# Patient Record
Sex: Female | Born: 1970
Health system: Southern US, Community
[De-identification: ages and names within clinical notes are randomized; demographics above are authoritative.]

## PROBLEM LIST (undated history)

## (undated) DIAGNOSIS — Z9889 Other specified postprocedural states: Secondary | ICD-10-CM

## (undated) DIAGNOSIS — Z86018 Personal history of other benign neoplasm: Secondary | ICD-10-CM

## (undated) DIAGNOSIS — M7751 Other enthesopathy of right foot: Secondary | ICD-10-CM

## (undated) DIAGNOSIS — R112 Nausea with vomiting, unspecified: Secondary | ICD-10-CM

## (undated) DIAGNOSIS — F419 Anxiety disorder, unspecified: Secondary | ICD-10-CM

## (undated) DIAGNOSIS — Z87898 Personal history of other specified conditions: Secondary | ICD-10-CM

## (undated) DIAGNOSIS — G43109 Migraine with aura, not intractable, without status migrainosus: Secondary | ICD-10-CM

## (undated) DIAGNOSIS — N841 Polyp of cervix uteri: Secondary | ICD-10-CM

## (undated) HISTORY — DX: Personal history of other benign neoplasm: Z86.018

## (undated) HISTORY — PX: SEPTOPLASTY: SUR1290

## (undated) HISTORY — DX: Personal history of other specified conditions: Z87.898

## (undated) HISTORY — PX: COLPOSCOPY: SHX161

## (undated) HISTORY — DX: Migraine with aura, not intractable, without status migrainosus: G43.109

## (undated) HISTORY — DX: Polyp of cervix uteri: N84.1

---
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
## 1992-04-13 HISTORY — PX: RHINOPLASTY: SUR1284

## 1999-05-13 ENCOUNTER — Other Ambulatory Visit: Admission: RE | Admit: 1999-05-13 | Discharge: 1999-05-13 | Payer: Self-pay | Admitting: *Deleted

## 2000-05-26 ENCOUNTER — Other Ambulatory Visit: Admission: RE | Admit: 2000-05-26 | Discharge: 2000-05-26 | Payer: Self-pay | Admitting: *Deleted

## 2001-05-04 ENCOUNTER — Other Ambulatory Visit: Admission: RE | Admit: 2001-05-04 | Discharge: 2001-05-04 | Payer: Self-pay | Admitting: *Deleted

## 2002-06-20 ENCOUNTER — Other Ambulatory Visit: Admission: RE | Admit: 2002-06-20 | Discharge: 2002-06-20 | Payer: Self-pay | Admitting: *Deleted

## 2003-03-01 ENCOUNTER — Encounter: Admission: RE | Admit: 2003-03-01 | Discharge: 2003-03-01 | Payer: Self-pay | Admitting: Family Medicine

## 2003-06-26 ENCOUNTER — Other Ambulatory Visit: Admission: RE | Admit: 2003-06-26 | Discharge: 2003-06-26 | Payer: Self-pay | Admitting: Family Medicine

## 2004-12-10 ENCOUNTER — Other Ambulatory Visit: Admission: RE | Admit: 2004-12-10 | Discharge: 2004-12-10 | Payer: Self-pay | Admitting: Family Medicine

## 2007-07-11 ENCOUNTER — Encounter: Admission: RE | Admit: 2007-07-11 | Discharge: 2007-07-11 | Payer: Self-pay | Admitting: Family Medicine

## 2008-01-31 ENCOUNTER — Encounter: Admission: RE | Admit: 2008-01-31 | Discharge: 2008-01-31 | Payer: Self-pay | Admitting: Family Medicine

## 2008-10-18 ENCOUNTER — Encounter: Admission: RE | Admit: 2008-10-18 | Discharge: 2008-10-18 | Payer: Self-pay | Admitting: Family Medicine

## 2009-10-21 ENCOUNTER — Encounter: Admission: RE | Admit: 2009-10-21 | Discharge: 2009-10-21 | Payer: Self-pay | Admitting: Family Medicine

## 2010-06-18 ENCOUNTER — Other Ambulatory Visit (HOSPITAL_COMMUNITY)
Admission: RE | Admit: 2010-06-18 | Discharge: 2010-06-18 | Disposition: A | Discharge status: Home / Self Care | Payer: 59 | Source: Ambulatory Visit | Attending: Family Medicine | Admitting: Family Medicine

## 2010-06-18 ENCOUNTER — Other Ambulatory Visit: Payer: Self-pay | Admitting: Family Medicine

## 2010-06-18 DIAGNOSIS — Z124 Encounter for screening for malignant neoplasm of cervix: Secondary | ICD-10-CM | POA: Insufficient documentation

## 2010-07-30 ENCOUNTER — Other Ambulatory Visit: Payer: Self-pay | Admitting: Dermatology

## 2010-11-26 ENCOUNTER — Other Ambulatory Visit: Payer: Self-pay | Admitting: Family Medicine

## 2010-11-26 DIAGNOSIS — Z1231 Encounter for screening mammogram for malignant neoplasm of breast: Secondary | ICD-10-CM

## 2010-12-17 ENCOUNTER — Ambulatory Visit
Admission: RE | Admit: 2010-12-17 | Discharge: 2010-12-17 | Disposition: A | Discharge status: Home / Self Care | Payer: 59 | Source: Ambulatory Visit | Attending: Family Medicine | Admitting: Family Medicine

## 2010-12-17 DIAGNOSIS — Z1231 Encounter for screening mammogram for malignant neoplasm of breast: Secondary | ICD-10-CM

## 2011-04-14 DIAGNOSIS — Z86018 Personal history of other benign neoplasm: Secondary | ICD-10-CM

## 2011-04-14 HISTORY — DX: Personal history of other benign neoplasm: Z86.018

## 2011-09-01 ENCOUNTER — Other Ambulatory Visit: Payer: Self-pay | Admitting: Family Medicine

## 2011-09-01 ENCOUNTER — Other Ambulatory Visit (HOSPITAL_COMMUNITY)
Admission: RE | Admit: 2011-09-01 | Discharge: 2011-09-01 | Disposition: A | Discharge status: Home / Self Care | Payer: 59 | Source: Ambulatory Visit | Attending: Family Medicine | Admitting: Family Medicine

## 2011-09-01 DIAGNOSIS — Z1159 Encounter for screening for other viral diseases: Secondary | ICD-10-CM | POA: Insufficient documentation

## 2011-09-01 DIAGNOSIS — Z124 Encounter for screening for malignant neoplasm of cervix: Secondary | ICD-10-CM | POA: Insufficient documentation

## 2011-09-25 DIAGNOSIS — N841 Polyp of cervix uteri: Secondary | ICD-10-CM

## 2011-09-25 HISTORY — DX: Polyp of cervix uteri: N84.1

## 2011-09-28 ENCOUNTER — Other Ambulatory Visit: Payer: Self-pay | Admitting: Family Medicine

## 2011-09-28 DIAGNOSIS — Z1231 Encounter for screening mammogram for malignant neoplasm of breast: Secondary | ICD-10-CM

## 2011-10-12 ENCOUNTER — Other Ambulatory Visit: Payer: Self-pay | Admitting: Family Medicine

## 2011-10-12 DIAGNOSIS — R2230 Localized swelling, mass and lump, unspecified upper limb: Secondary | ICD-10-CM

## 2011-10-14 ENCOUNTER — Other Ambulatory Visit: Payer: 59

## 2011-10-20 ENCOUNTER — Ambulatory Visit
Admission: RE | Admit: 2011-10-20 | Discharge: 2011-10-20 | Disposition: A | Discharge status: Home / Self Care | Payer: 59 | Source: Ambulatory Visit | Attending: Family Medicine | Admitting: Family Medicine

## 2011-10-20 DIAGNOSIS — R2230 Localized swelling, mass and lump, unspecified upper limb: Secondary | ICD-10-CM

## 2011-12-18 ENCOUNTER — Ambulatory Visit
Admission: RE | Admit: 2011-12-18 | Discharge: 2011-12-18 | Disposition: A | Discharge status: Home / Self Care | Payer: 59 | Source: Ambulatory Visit | Attending: Family Medicine | Admitting: Family Medicine

## 2011-12-18 DIAGNOSIS — Z1231 Encounter for screening mammogram for malignant neoplasm of breast: Secondary | ICD-10-CM

## 2012-03-12 ENCOUNTER — Encounter (HOSPITAL_COMMUNITY): Payer: Self-pay | Admitting: Pharmacist

## 2012-03-18 ENCOUNTER — Encounter (HOSPITAL_COMMUNITY)
Admission: RE | Admit: 2012-03-18 | Discharge: 2012-03-18 | Disposition: A | Discharge status: Home / Self Care | Payer: 59 | Source: Ambulatory Visit | Attending: Obstetrics and Gynecology | Admitting: Obstetrics and Gynecology

## 2012-03-18 ENCOUNTER — Encounter (HOSPITAL_COMMUNITY): Payer: Self-pay

## 2012-03-18 HISTORY — DX: Anxiety disorder, unspecified: F41.9

## 2012-03-18 LAB — CBC
HCT: 37.7 % (ref 36.0–46.0)
Hemoglobin: 12.5 g/dL (ref 12.0–15.0)
MCH: 34.2 pg — ABNORMAL HIGH (ref 26.0–34.0)
MCHC: 33.2 g/dL (ref 30.0–36.0)
MCV: 103.3 fL — ABNORMAL HIGH (ref 78.0–100.0)
Platelets: 192 10*3/uL (ref 150–400)
RBC: 3.65 MIL/uL — ABNORMAL LOW (ref 3.87–5.11)
RDW: 13.7 % (ref 11.5–15.5)
WBC: 7.5 10*3/uL (ref 4.0–10.5)

## 2012-03-18 LAB — SURGICAL PCR SCREEN
MRSA, PCR: NEGATIVE
Staphylococcus aureus: POSITIVE — AB

## 2012-03-18 NOTE — Patient Instructions (Addendum)
Your procedure is scheduled on:03/22/12  Enter through the Main Entrance at :6am Pick up desk phone and dial [phone] and inform us of your arrival.  Please call [phone] if you have any problems the morning of surgery.  Remember: Do not eat or drink after midnight:MONDAY   Take these meds the morning of surgery with a sip of water:none  DO NOT wear jewelry, eye make-up, lipstick,body lotion, or dark fingernail polish. Do not shave for 48 hours prior to surgery.  If you are to be admitted after surgery, leave suitcase in car until your room has been assigned. Patients discharged on the day of surgery will not be allowed to drive home.

## 2012-03-21 MED ORDER — DEXTROSE 5 % IV SOLN
2.0000 g | INTRAVENOUS | Status: AC
  Administered 2012-03-22: 2 g via INTRAVENOUS
  Filled 2012-03-21: qty 2

## 2012-03-21 NOTE — H&P (Signed)
Jasmine Cochran is an 41 y.o. female.   Chief Complaint: heavy periods HPI: 41 yo SWF G0P0 with severe menorrhagia, worsening over the last few years.  PUS showed her uterus to be 8 x 5 x 4 cm with 3 fibroids, each about 1 cm.  Adnexa wnl.  Endo bx benign 10/14/11.  Elects difinitive management with hysterectomy.  Past Medical History  Diagnosis Date  . Anxiety   . Headache     H/O migraines  . No pertinent past medical history     Past Surgical History  Procedure Date  . Septoplasty     No family history on file. Social History:  reports that she has never smoked. She does not have any smokeless tobacco history on file. She reports that she drinks alcohol. She reports that she does not use illicit drugs.  Allergies: No Known Allergies  No prescriptions prior to admission    No results found for this or any previous visit (from the past 48 hour(s)). No results found.  Review of Systems  All other systems reviewed and are negative.    Height 5\' 10"  (1.778 m), weight 67.132 kg (148 lb). Physical Exam  Constitutional: She is oriented to person, place, and time. She appears well-developed and well-nourished.  HENT:  Head: Normocephalic and atraumatic.  Eyes: Conjunctivae normal are normal.  Neck: Normal range of motion. Neck supple. No thyromegaly present.  Cardiovascular: Normal rate, regular rhythm and normal heart sounds.   Respiratory: Effort normal and breath sounds normal.  GI: Soft. Bowel sounds are normal. She exhibits no distension. There is no tenderness.  Genitourinary: Vagina normal and uterus normal.  Musculoskeletal: Normal range of motion.  Neurological: She is alert and oriented to person, place, and time.  Skin: Skin is warm and dry.  Psychiatric: She has a normal mood and affect.     Assessment/Plan Menorrhagia, fibroids.  For R-TLH, bilat salpingectomy.  ROMINE,CYNTHIA P 03/21/2012, 11:36 AM

## 2012-03-22 ENCOUNTER — Encounter (HOSPITAL_COMMUNITY): Payer: Self-pay | Admitting: *Deleted

## 2012-03-22 ENCOUNTER — Encounter (HOSPITAL_COMMUNITY)
Admission: RE | Disposition: A | Discharge status: Home / Self Care | Payer: Self-pay | Source: Ambulatory Visit | Attending: Obstetrics and Gynecology

## 2012-03-22 ENCOUNTER — Ambulatory Visit (HOSPITAL_COMMUNITY): Payer: 59 | Admitting: Anesthesiology

## 2012-03-22 ENCOUNTER — Encounter (HOSPITAL_COMMUNITY): Payer: Self-pay | Admitting: Anesthesiology

## 2012-03-22 ENCOUNTER — Ambulatory Visit (HOSPITAL_COMMUNITY)
Admission: RE | Admit: 2012-03-22 | Discharge: 2012-03-22 | Disposition: A | Discharge status: Home / Self Care | Payer: 59 | Source: Ambulatory Visit | Attending: Obstetrics and Gynecology | Admitting: Obstetrics and Gynecology

## 2012-03-22 DIAGNOSIS — N920 Excessive and frequent menstruation with regular cycle: Secondary | ICD-10-CM | POA: Insufficient documentation

## 2012-03-22 DIAGNOSIS — D259 Leiomyoma of uterus, unspecified: Secondary | ICD-10-CM | POA: Insufficient documentation

## 2012-03-22 DIAGNOSIS — N838 Other noninflammatory disorders of ovary, fallopian tube and broad ligament: Secondary | ICD-10-CM | POA: Insufficient documentation

## 2012-03-22 DIAGNOSIS — N8000 Endometriosis of the uterus, unspecified: Secondary | ICD-10-CM | POA: Insufficient documentation

## 2012-03-22 DIAGNOSIS — Z01812 Encounter for preprocedural laboratory examination: Secondary | ICD-10-CM | POA: Insufficient documentation

## 2012-03-22 DIAGNOSIS — Z01818 Encounter for other preprocedural examination: Secondary | ICD-10-CM | POA: Insufficient documentation

## 2012-03-22 HISTORY — PX: BILATERAL SALPINGECTOMY: SHX5743

## 2012-03-22 HISTORY — DX: Other specified postprocedural states: Z98.890

## 2012-03-22 HISTORY — PX: ROBOTIC ASSISTED TOTAL HYSTERECTOMY: SHX6085

## 2012-03-22 HISTORY — PX: CYSTOSCOPY: SHX5120

## 2012-03-22 HISTORY — DX: Other specified postprocedural states: R11.2

## 2012-03-22 SURGERY — ROBOTIC ASSISTED TOTAL HYSTERECTOMY
Anesthesia: General | Site: Bladder | Wound class: Clean Contaminated

## 2012-03-22 MED ORDER — DEXAMETHASONE SODIUM PHOSPHATE 10 MG/ML IJ SOLN
INTRAMUSCULAR | Status: DC | PRN
  Administered 2012-03-22: 10 mg via INTRAVENOUS

## 2012-03-22 MED ORDER — GLYCOPYRROLATE 0.2 MG/ML IJ SOLN
INTRAMUSCULAR | Status: DC | PRN
  Administered 2012-03-22: 0.6 mg via INTRAVENOUS

## 2012-03-22 MED ORDER — LIDOCAINE HCL (CARDIAC) 20 MG/ML IV SOLN
INTRAVENOUS | Status: DC | PRN
  Administered 2012-03-22: 50 mg via INTRAVENOUS

## 2012-03-22 MED ORDER — NEOSTIGMINE METHYLSULFATE 1 MG/ML IJ SOLN
INTRAMUSCULAR | Status: DC | PRN
  Administered 2012-03-22: 4 mg via INTRAVENOUS

## 2012-03-22 MED ORDER — MIDAZOLAM HCL 2 MG/2ML IJ SOLN
INTRAMUSCULAR | Status: AC
  Filled 2012-03-22: qty 2

## 2012-03-22 MED ORDER — ARTIFICIAL TEARS OP OINT
TOPICAL_OINTMENT | OPHTHALMIC | Status: DC | PRN
  Administered 2012-03-22: 1 via OPHTHALMIC

## 2012-03-22 MED ORDER — LIDOCAINE HCL (CARDIAC) 20 MG/ML IV SOLN
INTRAVENOUS | Status: AC
  Filled 2012-03-22: qty 5

## 2012-03-22 MED ORDER — SCOPOLAMINE 1 MG/3DAYS TD PT72
1.0000 | MEDICATED_PATCH | TRANSDERMAL | Status: DC
  Administered 2012-03-22: 1.5 mg via TRANSDERMAL

## 2012-03-22 MED ORDER — ACETAMINOPHEN 10 MG/ML IV SOLN
INTRAVENOUS | Status: AC
  Filled 2012-03-22: qty 100

## 2012-03-22 MED ORDER — ACETAMINOPHEN 10 MG/ML IV SOLN
1000.0000 mg | Freq: Once | INTRAVENOUS | Status: AC
  Administered 2012-03-22: 1000 mg via INTRAVENOUS

## 2012-03-22 MED ORDER — SODIUM CHLORIDE 0.9 % IJ SOLN
INTRAMUSCULAR | Status: DC | PRN
  Administered 2012-03-22: 60 mL

## 2012-03-22 MED ORDER — ROCURONIUM BROMIDE 50 MG/5ML IV SOLN
INTRAVENOUS | Status: AC
  Filled 2012-03-22: qty 1

## 2012-03-22 MED ORDER — MIDAZOLAM HCL 5 MG/5ML IJ SOLN
INTRAMUSCULAR | Status: DC | PRN
  Administered 2012-03-22: 2 mg via INTRAVENOUS

## 2012-03-22 MED ORDER — PROPOFOL 10 MG/ML IV BOLUS
INTRAVENOUS | Status: DC | PRN
  Administered 2012-03-22: 170 mg via INTRAVENOUS

## 2012-03-22 MED ORDER — STERILE WATER FOR IRRIGATION IR SOLN
Status: DC | PRN
  Administered 2012-03-22: 1000 mL via INTRAVESICAL

## 2012-03-22 MED ORDER — INDIGOTINDISULFONATE SODIUM 8 MG/ML IJ SOLN
INTRAMUSCULAR | Status: AC
  Filled 2012-03-22: qty 5

## 2012-03-22 MED ORDER — PROMETHAZINE HCL 25 MG/ML IJ SOLN: 6.2500 mg | INTRAMUSCULAR | Status: DC | PRN

## 2012-03-22 MED ORDER — GLYCOPYRROLATE 0.2 MG/ML IJ SOLN
INTRAMUSCULAR | Status: AC
  Filled 2012-03-22: qty 2

## 2012-03-22 MED ORDER — FENTANYL CITRATE 0.05 MG/ML IJ SOLN
INTRAMUSCULAR | Status: DC | PRN
  Administered 2012-03-22: 50 ug via INTRAVENOUS
  Administered 2012-03-22: 150 ug via INTRAVENOUS
  Administered 2012-03-22: 50 ug via INTRAVENOUS

## 2012-03-22 MED ORDER — MORPHINE SULFATE 4 MG/ML IJ SOLN: 1.0000 mg | INTRAMUSCULAR | Status: DC | PRN

## 2012-03-22 MED ORDER — GLYCOPYRROLATE 0.2 MG/ML IJ SOLN
INTRAMUSCULAR | Status: AC
  Filled 2012-03-22: qty 1

## 2012-03-22 MED ORDER — NEOSTIGMINE METHYLSULFATE 1 MG/ML IJ SOLN
INTRAMUSCULAR | Status: AC
  Filled 2012-03-22: qty 10

## 2012-03-22 MED ORDER — ONDANSETRON HCL 4 MG/2ML IJ SOLN
INTRAMUSCULAR | Status: AC
  Filled 2012-03-22: qty 2

## 2012-03-22 MED ORDER — OXYCODONE-ACETAMINOPHEN 5-325 MG PO TABS
1.0000 | ORAL_TABLET | ORAL | Status: DC | PRN
  Administered 2012-03-22: 1 via ORAL
  Filled 2012-03-22: qty 1

## 2012-03-22 MED ORDER — FENTANYL CITRATE 0.05 MG/ML IJ SOLN
INTRAMUSCULAR | Status: AC
  Administered 2012-03-22: 25 ug via INTRAVENOUS
  Filled 2012-03-22: qty 2

## 2012-03-22 MED ORDER — ARTIFICIAL TEARS OP OINT
TOPICAL_OINTMENT | OPHTHALMIC | Status: AC
  Filled 2012-03-22: qty 3.5

## 2012-03-22 MED ORDER — MEPERIDINE HCL 25 MG/ML IJ SOLN: 6.2500 mg | INTRAMUSCULAR | Status: DC | PRN

## 2012-03-22 MED ORDER — LACTATED RINGERS IR SOLN
Status: DC | PRN
  Administered 2012-03-22: 3000 mL

## 2012-03-22 MED ORDER — SCOPOLAMINE 1 MG/3DAYS TD PT72
MEDICATED_PATCH | TRANSDERMAL | Status: AC
  Administered 2012-03-22: 1.5 mg via TRANSDERMAL
  Filled 2012-03-22: qty 1

## 2012-03-22 MED ORDER — ROPIVACAINE HCL 5 MG/ML IJ SOLN
INTRAMUSCULAR | Status: AC
  Filled 2012-03-22: qty 60

## 2012-03-22 MED ORDER — FENTANYL CITRATE 0.05 MG/ML IJ SOLN
25.0000 ug | INTRAMUSCULAR | Status: DC | PRN
  Administered 2012-03-22: 25 ug via INTRAVENOUS

## 2012-03-22 MED ORDER — ROCURONIUM BROMIDE 100 MG/10ML IV SOLN
INTRAVENOUS | Status: DC | PRN
  Administered 2012-03-22: 10 mg via INTRAVENOUS
  Administered 2012-03-22: 60 mg via INTRAVENOUS

## 2012-03-22 MED ORDER — INDIGOTINDISULFONATE SODIUM 8 MG/ML IJ SOLN
INTRAMUSCULAR | Status: DC | PRN
  Administered 2012-03-22: 5 mL via INTRAVENOUS

## 2012-03-22 MED ORDER — FENTANYL CITRATE 0.05 MG/ML IJ SOLN
INTRAMUSCULAR | Status: AC
  Filled 2012-03-22: qty 5

## 2012-03-22 MED ORDER — ONDANSETRON HCL 4 MG/2ML IJ SOLN
INTRAMUSCULAR | Status: DC | PRN
  Administered 2012-03-22: 4 mg via INTRAVENOUS

## 2012-03-22 MED ORDER — DEXAMETHASONE SODIUM PHOSPHATE 10 MG/ML IJ SOLN
INTRAMUSCULAR | Status: AC
  Filled 2012-03-22: qty 1

## 2012-03-22 MED ORDER — LACTATED RINGERS IV SOLN
INTRAVENOUS | Status: DC
  Administered 2012-03-22: 09:00:00 via INTRAVENOUS
  Administered 2012-03-22: 125 mL/h via INTRAVENOUS

## 2012-03-22 MED ORDER — ROPIVACAINE HCL 5 MG/ML IJ SOLN
INTRAMUSCULAR | Status: DC | PRN
  Administered 2012-03-22: 60 mL

## 2012-03-22 MED ORDER — PROPOFOL 10 MG/ML IV EMUL
INTRAVENOUS | Status: AC
  Filled 2012-03-22: qty 20

## 2012-03-22 SURGICAL SUPPLY — 77 items
ADH SKN CLS APL DERMABOND .7 (GAUZE/BANDAGES/DRESSINGS) ×3
APL SKNCLS STERI-STRIP NONHPOA (GAUZE/BANDAGES/DRESSINGS)
BAG URINE DRAINAGE (UROLOGICAL SUPPLIES) ×4 IMPLANT
BARRIER ADHS 3X4 INTERCEED (GAUZE/BANDAGES/DRESSINGS) ×4 IMPLANT
BENZOIN TINCTURE PRP APPL 2/3 (GAUZE/BANDAGES/DRESSINGS) ×3 IMPLANT
BRR ADH 4X3 ABS CNTRL BYND (GAUZE/BANDAGES/DRESSINGS) ×3
CABLE HIGH FREQUENCY MONO STRZ (ELECTRODE) ×4 IMPLANT
CHLORAPREP W/TINT 26ML (MISCELLANEOUS) ×4 IMPLANT
CONT PATH 16OZ SNAP LID 3702 (MISCELLANEOUS) ×4 IMPLANT
COVER MAYO STAND STRL (DRAPES) ×4 IMPLANT
COVER TABLE BACK 60X90 (DRAPES) ×8 IMPLANT
COVER TIP SHEARS 8 DVNC (MISCELLANEOUS) ×3 IMPLANT
COVER TIP SHEARS 8MM DA VINCI (MISCELLANEOUS) ×1
DECANTER SPIKE VIAL GLASS SM (MISCELLANEOUS) ×7 IMPLANT
DERMABOND ADVANCED (GAUZE/BANDAGES/DRESSINGS) ×1
DERMABOND ADVANCED .7 DNX12 (GAUZE/BANDAGES/DRESSINGS) ×2 IMPLANT
DRAPE HUG U DISPOSABLE (DRAPE) ×4 IMPLANT
DRAPE LG THREE QUARTER DISP (DRAPES) ×8 IMPLANT
DRAPE WARM FLUID 44X44 (DRAPE) ×4 IMPLANT
ELECT REM PT RETURN 9FT ADLT (ELECTROSURGICAL) ×4
ELECTRODE REM PT RTRN 9FT ADLT (ELECTROSURGICAL) ×3 IMPLANT
EVACUATOR SMOKE 8.L (FILTER) ×4 IMPLANT
GAUZE VASELINE 3X9 (GAUZE/BANDAGES/DRESSINGS) IMPLANT
GLOVE BIO SURGEON STRL SZ 6.5 (GLOVE) ×13 IMPLANT
GLOVE BIOGEL PI IND STRL 7.0 (GLOVE) ×12 IMPLANT
GLOVE BIOGEL PI INDICATOR 7.0 (GLOVE) ×5
GLOVE ECLIPSE 6.5 STRL STRAW (GLOVE) ×16 IMPLANT
GLOVE SURG SS PI 7.0 STRL IVOR (GLOVE) ×4 IMPLANT
GOWN STRL REIN XL XLG (GOWN DISPOSABLE) ×26 IMPLANT
GYRUS RUMI II 2.5CM BLUE (DISPOSABLE)
GYRUS RUMI II 3.5CM BLUE (DISPOSABLE)
GYRUS RUMI II 4.0CM BLUE (DISPOSABLE)
KIT ACCESSORY DA VINCI DISP (KITS) ×1
KIT ACCESSORY DVNC DISP (KITS) ×3 IMPLANT
LEGGING LITHOTOMY PAIR STRL (DRAPES) ×4 IMPLANT
NDL INSUFFLATION 14GA 120MM (NEEDLE) ×2 IMPLANT
NEEDLE INSUFFLATION 14GA 120MM (NEEDLE) ×4 IMPLANT
OCCLUDER COLPOPNEUMO (BALLOONS) ×1 IMPLANT
PACK LAVH (CUSTOM PROCEDURE TRAY) ×4 IMPLANT
PAD PREP 24X48 CUFFED NSTRL (MISCELLANEOUS) ×8 IMPLANT
PLUG CATH AND CAP STER (CATHETERS) ×4 IMPLANT
PROTECTOR NERVE ULNAR (MISCELLANEOUS) ×9 IMPLANT
RUMI II 3.0CM BLUE KOH-EFFICIE (DISPOSABLE) IMPLANT
RUMI II GYRUS 2.5CM BLUE (DISPOSABLE) IMPLANT
RUMI II GYRUS 3.5CM BLUE (DISPOSABLE) IMPLANT
RUMI II GYRUS 4.0CM BLUE (DISPOSABLE) IMPLANT
SET CYSTO W/LG BORE CLAMP LF (SET/KITS/TRAYS/PACK) ×4 IMPLANT
SET IRRIG TUBING LAPAROSCOPIC (IRRIGATION / IRRIGATOR) ×4 IMPLANT
SOLUTION ELECTROLUBE (MISCELLANEOUS) ×4 IMPLANT
STRIP CLOSURE SKIN 1/4X4 (GAUZE/BANDAGES/DRESSINGS) ×4 IMPLANT
SUT VIC AB 0 CT1 27 (SUTURE) ×12
SUT VIC AB 0 CT1 27XBRD ANBCTR (SUTURE) ×4 IMPLANT
SUT VIC AB 0 CT1 27XBRD ANTBC (SUTURE) IMPLANT
SUT VICRYL 0 UR6 27IN ABS (SUTURE) ×4 IMPLANT
SUT VICRYL RAPIDE 4/0 PS 2 (SUTURE) ×8 IMPLANT
SUT VLOC 180 0 9IN  GS21 (SUTURE) ×1
SUT VLOC 180 0 9IN GS21 (SUTURE) IMPLANT
SYR 30ML LL (SYRINGE) ×4 IMPLANT
SYR 50ML LL SCALE MARK (SYRINGE) ×4 IMPLANT
SYRINGE 10CC LL (SYRINGE) ×4 IMPLANT
SYSTEM CONVERTIBLE TROCAR (TROCAR) ×4 IMPLANT
TIP RUMI ORANGE 6.7MMX12CM (TIP) IMPLANT
TIP UTERINE 5.1X6CM LAV DISP (MISCELLANEOUS) IMPLANT
TIP UTERINE 6.7X10CM GRN DISP (MISCELLANEOUS) IMPLANT
TIP UTERINE 6.7X6CM WHT DISP (MISCELLANEOUS) IMPLANT
TIP UTERINE 6.7X8CM BLUE DISP (MISCELLANEOUS) ×3 IMPLANT
TOWEL OR 17X24 6PK STRL BLUE (TOWEL DISPOSABLE) ×12 IMPLANT
TRAY FOLEY BAG SILVER LF 14FR (CATHETERS) ×4 IMPLANT
TROCAR 12M 150ML BLUNT (TROCAR) ×2 IMPLANT
TROCAR DISP BLADELESS 8 DVNC (TROCAR) ×3 IMPLANT
TROCAR DISP BLADELESS 8MM (TROCAR) ×1
TROCAR XCEL 12X100 BLDLESS (ENDOMECHANICALS) IMPLANT
TROCAR XCEL NON-BLD 5MMX100MML (ENDOMECHANICALS) ×4 IMPLANT
TROCAR Z THRD FIOS 12X150 (TROCAR) ×4 IMPLANT
TUBING FILTER THERMOFLATOR (ELECTROSURGICAL) ×4 IMPLANT
WARMER LAPAROSCOPE (MISCELLANEOUS) ×4 IMPLANT
WATER STERILE IRR 1000ML POUR (IV SOLUTION) ×12 IMPLANT

## 2012-03-22 NOTE — Transfer of Care (Signed)
Immediate Anesthesia Transfer of Care Note  Patient: Jasmine Cochran  Procedure(s) Performed: Procedure(s) (LRB) with comments: ROBOTIC ASSISTED TOTAL HYSTERECTOMY (N/A) BILATERAL SALPINGECTOMY (Bilateral) CYSTOSCOPY (N/A)  Patient Location: PACU  Anesthesia Type:General  Level of Consciousness: awake, alert  and patient cooperative  Airway & Oxygen Therapy: Patient Spontanous Breathing and Patient connected to nasal cannula oxygen  Post-op Assessment: Report given to PACU RN  Post vital signs: Reviewed  Complications: No apparent anesthesia complications

## 2012-03-22 NOTE — Interval H&P Note (Signed)
History and Physical Interval Note:  03/22/2012 7:17 AM  Jasmine Cochran  has presented today for surgery, with the diagnosis of Menorrhagia; Fibroids  The various methods of treatment have been discussed with the patient and family. After consideration of risks, benefits and other options for treatment, the patient has consented to  Procedure(s) (LRB) with comments: ROBOTIC ASSISTED TOTAL HYSTERECTOMY (N/A) - request 2.5 hours as a surgical intervention .  The patient's history has been reviewed, patient examined, no change in status, stable for surgery.  I have reviewed the patient's chart and labs.  Questions were answered to the patient's satisfaction.     [place] P

## 2012-03-22 NOTE — Preoperative (Signed)
Beta Blockers   Reason not to administer Beta Blockers:Not Applicable 

## 2012-03-22 NOTE — Anesthesia Preprocedure Evaluation (Addendum)
Anesthesia Evaluation  Patient identified by MRN, date of birth, ID band Patient awake    Reviewed: Allergy & Precautions, H&P , NPO status , Patient's Chart, lab work & pertinent test results  History of Anesthesia Complications (+) PONV  Airway Mallampati: II TM Distance: >3 FB Neck ROM: Full    Dental  (+) Teeth Intact and Chipped,    Pulmonary neg pulmonary ROS,  breath sounds clear to auscultation        Cardiovascular negative cardio ROS  Rhythm:Regular Rate:Normal     Neuro/Psych  Headaches, Anxiety    GI/Hepatic negative GI ROS, Neg liver ROS,   Endo/Other  negative endocrine ROS  Renal/GU negative Renal ROS  negative genitourinary   Musculoskeletal negative musculoskeletal ROS (+)   Abdominal Normal abdominal exam  (+) - obese,   Peds  Hematology negative hematology ROS (+)   Anesthesia Other Findings   Reproductive/Obstetrics Menorrhagia  Fibroid Uterus                          Anesthesia Physical Anesthesia Plan  ASA: II  Anesthesia Plan: General   Post-op Pain Management:    Induction: Intravenous  Airway Management Planned: Oral ETT  Additional Equipment:   Intra-op Plan:   Post-operative Plan: Extubation in OR  Informed Consent: I have reviewed the patients History and Physical, chart, labs and discussed the procedure including the risks, benefits and alternatives for the proposed anesthesia with the patient or authorized representative who has indicated his/her understanding and acceptance.   Dental advisory given  Plan Discussed with: CRNA, Anesthesiologist and Surgeon  Anesthesia Plan Comments:         Anesthesia Quick Evaluation

## 2012-03-22 NOTE — Anesthesia Postprocedure Evaluation (Signed)
Anesthesia Post Note  Patient: Jasmine Cochran  Procedure(s) Performed: Procedure(s) (LRB): ROBOTIC ASSISTED TOTAL HYSTERECTOMY (N/A) BILATERAL SALPINGECTOMY (Bilateral) CYSTOSCOPY (N/A)  Anesthesia type: General  Patient location: PACU  Post pain: Pain level controlled  Post assessment: Post-op Vital signs reviewed  Last Vitals:  Filed Vitals:   03/22/12 1015  BP: 104/64  Pulse: 52  Temp:   Resp: 18    Post vital signs: Reviewed  Level of consciousness: sedated  Complications: No apparent anesthesia complications

## 2012-03-22 NOTE — Op Note (Signed)
Preoperative diagnosis: Menorrhagia, fibroids Postoperative diagnosis: Same, path pending Procedure: Robotic assisted total laparoscopic hysterectomy, bilateral salpingectomy, cystoscopy Surgeon: Dr. Meredeth Ide Assistant: Dr. Douglass Rivers Anesthesia: Gen. endotracheal Estimated blood loss: 50 cc Complications: None  Procedure: The patient was taken to the operating room, and after she was given IV sedation, she was placed in the low dorsolithotomy position.  She was then prepped and draped in usual fashion.  A posterior weighted and anterior Deaver retractor were placed into the vagina.  The cervix was grasped on its anterior lip with a single-tooth tenaculum.  The cervix was dilated to a #19 Shawnie Pons.  The uterus sounded to 8 cm     . An 8 cm      Rumi manipulator with a  small KOH ring was placed without difficulty.  A Foley catheter was then placed. The skin at the umbilicus was anesthetized with quarter percent ropivacaine, incised, and a vares needle was inserted into the peritoneal space.  Proper placement was noted using the hanging drop technique.  Pneumoperitoneum was accomplished using the automatic insufflator with 3 L of CO2.  A 12 mm bladed trocar was then inserted into the peritoneal space and its proper placement noted with the laparoscope.  60 cc of quarter percent ropivacaine was inserted into the peritoneal space.  Sites were marked for the robotic trocars and for the assistance trocar using transillumination, the skin was anesthetized with quarter percent ropivicaine, incised with a knife, and the trocars were inserted under direct visualization.  The patient was then placed in steep Trendelenburg position the robot was brought in and docked.  Monopolar scissors were inserted through port one and the PK gyrus through port two, both under direct visualization.  The surgeon then proceeded to the console.  The ureters were identified and seen peristalsing bilaterally.  The procedure began  on the patient's right, identifying the fallopian tube, and separating it from the ovary and along the length of the mesosalpinx with serial coagulation and cutting.  The utero-ovarian ligament was then cauterized and cut.  The round ligament on the right was cauterized and cut.  The anterior and posterior leaves of the broad ligament were taken down sharply.  The bladder was dissected sharply off the [facility] ring.  The uterine artery was skeletonized, coagulated multiple times, and cut.  Attention was next turned to the patient's left.  The tube was likewise cauterized and cut along its length to separate it from the ovary and the mesosalpinx.  The utero-ovarian ligament on the left was cauterized and cut.  The round ligament on the left was cauterized and cut.  The anterior posterior leaves of the broad ligament were taken down sharply.  Further dissection was done to dissect the bladder off the [facility] ring.  The left uterine artery was skeletonized, coagulated multiple times, and cut.  A circumferential colpotomy incision was then made with monopolar scissors.  The uterus was then delivered into the vagina with the fallopian tubes attached.  Robotic instruments were then changed out for a mega-suture cut needle driver in port 1 and a Cobra grasper in port 2.   A V. LOC suture was dropped in through the trocar at the umbilicus, and carried into the pelvis.  The vaginal cuff was closed with a 0 Vicryl running V. LOC suture.  The needle was parked in the right paracolic gutter.  The pelvis was irrigated and found to be hemostatic.  The ureters were again identified and seen peristalsing bilaterally.  A sheet of Interceed was brought in through one of the robotic trocars and placed over the vaginal cuff.  Robotic instruments were then removed.  An 8 mm laparoscope was used to enable a [place] forceps to go in through the umbilical port and retrieve the needle the that had been parked in the right paracolic gutter.  Trocars  were then removed under direct visualization.  Pneumoperitoneum was allowed to escape.  While the umbilical trocar was still in place, the anesthetist was asked to give the patient manual deep breaths to assist in removing the CO2 from the abdomen.  That trocar was then removed.  The fascia was closed at the umbilicus with a single suture of 0 Vicryl.  The incisions were then closed with 4-0 Vicryl Rapide, and Dermabond.  The surgeon then proceeded to perform cystoscopy.  The Foley catheter was removed.  The cystoscope was inserted and 100 cc of sterile water was instilled into the bladder.  Approximately 10 minutes before the cystoscopy started, an amp of indigo carmine had been injected per anesthesia IV.  Good efflux of indigo carmine could be seen from both ureteral orifices. The dome of the bladder was inspected and was normal.  The scope was withdrawn, and the bladder was drained.  Procedure was terminated.  Sponge needle and instrument counts were correct x3.  The patient was taken to the recovery room in satisfactory condition.

## 2012-03-22 NOTE — Progress Notes (Signed)
Pt discharged to home with friend, Larita Fife.  Condition stable - during afternoon pt tolerated regular diet, pain controlled with oral pain medication, voided without difficulty, and ambulated in the hall.  Pt ambulated to car with Claudia Desanctis, RN.  No equipment for home ordered at discharge.

## 2012-03-25 ENCOUNTER — Encounter (HOSPITAL_COMMUNITY): Payer: Self-pay | Admitting: Obstetrics and Gynecology

## 2012-09-13 ENCOUNTER — Encounter: Payer: Self-pay | Admitting: Obstetrics and Gynecology

## 2012-09-14 ENCOUNTER — Encounter: Payer: Self-pay | Admitting: Obstetrics and Gynecology

## 2012-09-14 ENCOUNTER — Ambulatory Visit (INDEPENDENT_AMBULATORY_CARE_PROVIDER_SITE_OTHER): Payer: 59 | Admitting: Obstetrics and Gynecology

## 2012-09-14 VITALS — BP 110/62 | Ht 69.0 in | Wt 146.0 lb

## 2012-09-14 DIAGNOSIS — Z0000 Encounter for general adult medical examination without abnormal findings: Secondary | ICD-10-CM

## 2012-09-14 DIAGNOSIS — Z01419 Encounter for gynecological examination (general) (routine) without abnormal findings: Secondary | ICD-10-CM

## 2012-09-14 LAB — POCT URINALYSIS DIPSTICK
Bilirubin, UA: NEGATIVE
Ketones, UA: NEGATIVE
Leukocytes, UA: NEGATIVE
pH, UA: 8

## 2012-09-14 NOTE — Patient Instructions (Addendum)
EXERCISE AND DIET:  We recommended that you start or continue a regular exercise program for good health. Regular exercise means any activity that makes your heart beat faster and makes you sweat.  We recommend exercising at least 30 minutes per day at least 3 days a week, preferably 4 or 5.  We also recommend a diet low in fat and sugar.  Inactivity, poor dietary choices and obesity can cause diabetes, heart attack, stroke, and kidney damage, among others.    ALCOHOL AND SMOKING:  Women should limit their alcohol intake to no more than 7 drinks/beers/glasses of wine (combined, not each!) per week. Moderation of alcohol intake to this level decreases your risk of breast cancer and liver damage. And of course, no recreational drugs are part of a healthy lifestyle.  And absolutely no smoking or even second hand smoke. Most people know smoking can cause heart and lung diseases, but did you know it also contributes to weakening of your bones? Aging of your skin?  Yellowing of your teeth and nails?  CALCIUM AND VITAMIN D:  Adequate intake of calcium and Vitamin D are recommended.  The recommendations for exact amounts of these supplements seem to change often, but generally speaking 600 mg of calcium (either carbonate or citrate) and 800 units of Vitamin D per day seems prudent. Certain women may benefit from higher intake of Vitamin D.  If you are among these women, your doctor will have told you during your visit.    PAP SMEARS:  Pap smears, to check for cervical cancer or precancers,  have traditionally been done yearly, although recent scientific advances have shown that most women can have pap smears less often.  However, every woman still should have a physical exam from her gynecologist every year. It will include a breast check, inspection of the vulva and vagina to check for abnormal growths or skin changes, a visual exam of the cervix, and then an exam to evaluate the size and shape of the uterus and  ovaries.  And after 44 years of age, a rectal exam is indicated to check for rectal cancers. We will also provide age appropriate advice regarding health maintenance, like when you should have certain vaccines, screening for sexually transmitted diseases, bone density testing, colonoscopy, mammograms, etc.   MAMMOGRAMS:  All women over 40 years old should have a yearly mammogram. Many facilities now offer a "3D" mammogram, which may cost around $50 extra out of pocket. If possible,  we recommend you accept the option to have the 3D mammogram performed.  It both reduces the number of women who will be called back for extra views which then turn out to be normal, and it is better than the routine mammogram at detecting truly abnormal areas.       

## 2012-09-14 NOTE — Progress Notes (Signed)
42 y.o.   Single    Caucasian   female   G0P0000   here for annual exam.  Going to [place] for two weeks starting in two weeks, with her sister.  Had an AnEx with PCP last week.    Patient's last menstrual period was 02/27/2012.          Sexually active: no  The current method of family planning is abstinence and status post hysterectomy.    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 Diabetes Father   . Hypertension Father   . Heart disease Father   . Depression Father   . Anxiety disorder Father   . Uterine cancer Mother   . Osteoporosis Mother   . Multiple births Paternal Grandmother     twins  . Hypertension Paternal Grandfather   . Heart disease Paternal Grandfather   . Osteoporosis Maternal Grandmother     severe    There are no active problems to display for this patient.   Past Medical History  Diagnosis Date  . Anxiety   . Migraine with aura     1-2 per year  . PONV (postoperative nausea and vomiting)   . History of abnormal Pap smear     colpo, no treatment  . Cervical polyp 09/25/11    negative  . History of uterine fibroid 2013    82g, TLH    Past Surgical History  Procedure Laterality Date  . Septoplasty    . Robotic assisted total hysterectomy  03/22/2012    Procedure: ROBOTIC ASSISTED TOTAL HYSTERECTOMY;  Surgeon: Alison Murray, MD;  Location: [facility];  Service: Gynecology;  Laterality: N/A;  . Bilateral salpingectomy  03/22/2012    Procedure: BILATERAL SALPINGECTOMY;  Surgeon: Alison Murray, MD;  Location: [facility];  Service: Gynecology;  Laterality: Bilateral;  . Cystoscopy  03/22/2012    Procedure:  CYSTOSCOPY;  Surgeon: Alison Murray, MD;  Location: [facility];  Service: Gynecology;  Laterality: N/A;  . Colposcopy  years ago    no treatment  . Rhinoplasty  1994    Allergies: Review of patient's allergies indicates no known allergies.  Current Outpatient Prescriptions  Medication Sig Dispense Refill  . ALPRAZolam (XANAX) 0.25 MG tablet Take 0.25 mg by mouth as needed. For stress / anxiety      . CALCIUM-VITAMIN D PO Take 1 tablet by mouth daily.      . Cholecalciferol (VITAMIN D PO) Take 1,000 Units by mouth once a week.      . cyanocobalamin 1000 MCG tablet Take 100 mcg by mouth daily.      . folic acid (FOLVITE) 400 MCG tablet Take 400 mcg by mouth daily.      Marland Kitchen GLUCOSAMINE-CHONDROITIN-MSM PO Take 1 tablet by mouth daily. Pt says the strength is  Glucosamin 750mg /Condroitin 400mg /MSM 375mg       . Multiple Vitamins-Minerals (MULTIVITAMIN WITH MINERALS) tablet Take 1 tablet by mouth daily.       No current facility-administered medications for this visit.    ROS: Pertinent items are noted in HPI.  Social Hx:  Single, no  children, yoga instructor  Exam:    BP 110/62  Ht 5\' 9"  (1.753 m)  Wt 146 lb (66.225 kg)  BMI 21.55 kg/m2  LMP 02/27/2012   Wt Readings from Last 3 Encounters:  09/14/12 146 lb (66.225 kg)  03/22/12 150 lb (68.04 kg)  03/22/12 150 lb (68.04 kg)     Ht Readings from Last 3 Encounters:  09/14/12 5\' 9"  (1.753 m)  03/22/12 5\' 10"  (1.778 m)  03/22/12 5\' 10"  (1.778 m)    General appearance: alert, cooperative and appears stated age Head: Normocephalic, without obvious abnormality, atraumatic Neck: no adenopathy, supple, symmetrical, trachea midline and thyroid not enlarged, symmetric, no tenderness/mass/nodules Lungs: clear to auscultation bilaterally Breasts: Inspection negative, No nipple retraction or dimpling, No nipple discharge or bleeding, No axillary or supraclavicular adenopathy, Normal to palpation without dominant masses Heart: regular rate and  rhythm Abdomen: soft, non-tender; bowel sounds normal; no masses,  no organomegaly Extremities: extremities normal, atraumatic, no cyanosis or edema Skin: Skin color, texture, turgor normal. No rashes or lesions Lymph nodes: Cervical, supraclavicular, and axillary nodes normal. No abnormal inguinal nodes palpated Neurologic: Grossly normal   Pelvic: External genitalia:  no lesions              Urethra:  normal appearing urethra with no masses, tenderness or lesions              Bartholins and Skenes: normal                 Vagina: normal appearing vagina with normal color and discharge, no lesions              Cervix: absent              Pap taken: no        Bimanual Exam:  Uterus:  absent                                      Adnexa: normal adnexa in size, nontender and no masses                                      Rectovaginal: Confirms                                      Anus:  normal sphincter tone, no lesions  A: normal gyn exam     S/p R-TLH, bilat salp Dec 2013     Migraine with aura, anxiety, pernicious anemia     P: mammogram counseled on breast self exam, mammography screening, adequate intake of calcium and vitamin D, diet and exercise return annually or prn     An After Visit Summary was printed and given to the patient.

## 2013-01-24 ENCOUNTER — Other Ambulatory Visit: Payer: Self-pay

## 2013-01-24 DIAGNOSIS — Z1231 Encounter for screening mammogram for malignant neoplasm of breast: Secondary | ICD-10-CM

## 2013-02-17 ENCOUNTER — Ambulatory Visit
Admission: RE | Admit: 2013-02-17 | Discharge: 2013-02-17 | Disposition: A | Discharge status: Home / Self Care | Payer: 59 | Source: Ambulatory Visit

## 2013-02-17 DIAGNOSIS — Z1231 Encounter for screening mammogram for malignant neoplasm of breast: Secondary | ICD-10-CM

## 2013-06-13 ENCOUNTER — Encounter: Payer: Self-pay | Admitting: Family Medicine

## 2013-06-13 ENCOUNTER — Ambulatory Visit (INDEPENDENT_AMBULATORY_CARE_PROVIDER_SITE_OTHER): Payer: 59 | Admitting: Family Medicine

## 2013-06-13 VITALS — BP 110/60 | HR 74 | Temp 98.4°F | Resp 16 | Ht 69.25 in | Wt 148.0 lb

## 2013-06-13 DIAGNOSIS — J029 Acute pharyngitis, unspecified: Secondary | ICD-10-CM

## 2013-06-13 LAB — POCT RAPID STREP A (OFFICE): Rapid Strep A Screen: NEGATIVE

## 2013-06-13 NOTE — Progress Notes (Signed)
Patient ID: Jasmine Cochran MRN: [id number], DOB: 04-Jan-1971, 43 y.o. Date of Encounter: 06/13/2013, 11:39 AM  Primary Physician: Juanell Fairly, MD  Chief Complaint:  Chief Complaint  Patient presents with  . Sinus Problem    sinus drainage productive cough laryngitis sore throat symptoms since Sum pm    HPI: 43 y.o. year old female presents with day history of sore throat. Subjective fever and chills. No cough, congestion, rhinorrhea, sinus pressure, otalgia, or headache. Normal hearing. No GI complaints. Able to swallow saliva, but hurts to do so. Decreased appetite secondary to sore throat.  She has developed a cough.  Past Medical History  Diagnosis Date  . Anxiety   . Migraine with aura     1-2 per year  . PONV (postoperative nausea and vomiting)   . History of abnormal Pap smear     colpo, no treatment  . Cervical polyp 09/25/11    negative  . History of uterine fibroid 2013    82g, TLH     Home Meds: Prior to Admission medications   Medication Sig Start Date End Date Taking? Authorizing Provider  CALCIUM-VITAMIN D PO Take 1 tablet by mouth daily.   Yes Historical Provider, MD  Cholecalciferol (VITAMIN D PO) Take 1,000 Units by mouth once a week.   Yes Historical Provider, MD  cyanocobalamin 1000 MCG tablet Take 100 mcg by mouth daily.   Yes Historical Provider, MD  folic acid (FOLVITE) 431 MCG tablet Take 400 mcg by mouth daily.   Yes Historical Provider, MD  GLUCOSAMINE-CHONDROITIN-MSM PO Take 1 tablet by mouth daily. Pt says the strength is  Glucosamin 750mg /Condroitin 400mg /MSM 375mg    Yes Historical Provider, MD  Multiple Vitamins-Minerals (MULTIVITAMIN WITH MINERALS) tablet Take 1 tablet by mouth daily.   Yes Historical Provider, MD  ALPRAZolam (XANAX) 0.25 MG tablet Take 0.25 mg by mouth as needed. For stress / anxiety    Historical Provider, MD    Allergies: No Known Allergies  History   Social History  . Marital Status: Single    Spouse Name: N/A   Number of Children: 0  . Years of Education: N/A   Occupational History  . [employer]   Social History Main Topics  . Smoking status: Never Smoker   . Smokeless tobacco: Never Used  . Alcohol Use: .5 - 1 oz/week    1-2 drink(s) per week     Comment: 1-2 drinks a week wine or beer  . Drug Use: No  . Sexual Activity: Not Currently    Partners: Male    Birth Control/ Protection: Surgical     Comment: R-TLH Bilat Salp 03/22/12   Other Topics Concern  . Not on file   Social History Narrative  . No narrative on file     Review of Systems: Constitutional: negative for chills, fever, night sweats or weight changes HEENT: see above Cardiovascular: negative for chest pain or palpitations Respiratory: negative for hemoptysis, wheezing, or shortness of breath Abdominal: negative for abdominal pain, nausea, vomiting or diarrhea Dermatological: negative for rash Neurologic: negative for headache   Physical Exam: Blood pressure 110/60, pulse 74, temperature 98.4 F (36.9 C), temperature source Oral, resp. rate 16, height 5' 9.25" (1.759 m), weight 148 lb (67.132 kg), last menstrual period 02/27/2012, SpO2 99.00%., Body mass index is 21.7 kg/(m^2). General: Well developed, well nourished, in no acute distress. Head: Normocephalic, atraumatic, eyes without discharge, sclera non-icteric, nares are patent. Bilateral auditory canals clear, TM's  are without perforation, pearly grey with reflective cone of light bilaterally. No sinus TTP. Oral cavity moist, dentition normal. Posterior pharynx with post nasal drip and mild erythema. No peritonsillar abscess or tonsillar exudate. Neck: Supple. No thyromegaly. Full ROM. No lymphadenopathy. Lungs: Clear bilaterally to auscultation without wheezes, rales, or rhonchi. Breathing is unlabored. Heart: RRR with S1 S2. No murmurs, rubs, or gallops appreciated. Abdomen: Soft, non-tender, non-distended with normoactive bowel sounds. No  hepatomegaly. No rebound/guarding. No obvious abdominal masses. Msk:  Strength and tone normal for age. Extremities: No clubbing or cyanosis. No edema. Neuro: Alert and oriented X 3. Moves all extremities spontaneously. CNII-XII grossly in tact. Psych:  Responds to questions appropriately with a normal affect.   Labs:  RST Results for orders placed in visit on 06/13/13  POCT RAPID STREP A (OFFICE)      Result Value Ref Range   Rapid Strep A Screen Negative  Negative      ASSESSMENT AND PLAN:  43 y.o. year old female with sore throat and cough - -Tylenol/Motrin prn -Rest/fluids -RTC precautions -RTC 3-5 days if no improvement  Signed, Robyn Haber, MD 06/13/2013 11:39 AM

## 2013-06-13 NOTE — Patient Instructions (Signed)
Sore Throat A sore throat is pain, burning, irritation, or scratchiness of the throat. There is often pain or tenderness when swallowing or talking. A sore throat may be accompanied by other symptoms, such as coughing, sneezing, fever, and swollen neck glands. A sore throat is often the first sign of another sickness, such as a cold, flu, strep throat, or mononucleosis (commonly known as mono). Most sore throats go away without medical treatment. CAUSES  The most common causes of a sore throat include:  A viral infection, such as a cold, flu, or mono.  A bacterial infection, such as strep throat, tonsillitis, or whooping cough.  Seasonal allergies.  Dryness in the air.  Irritants, such as smoke or pollution.  Gastroesophageal reflux disease (GERD). HOME CARE INSTRUCTIONS   Only take over-the-counter medicines as directed by your caregiver.  Drink enough fluids to keep your urine clear or pale yellow.  Rest as needed.  Try using throat sprays, lozenges, or sucking on hard candy to ease any pain (if older than 4 years or as directed).  Sip warm liquids, such as broth, herbal tea, or warm water with honey to relieve pain temporarily. You may also eat or drink cold or frozen liquids such as frozen ice pops.  Gargle with salt water (mix 1 tsp salt with 8 oz of water).  Do not smoke and avoid secondhand smoke.  Put a cool-mist humidifier in your bedroom at night to moisten the air. You can also turn on a hot shower and sit in the bathroom with the door closed for 5 10 minutes. SEEK IMMEDIATE MEDICAL CARE IF:  You have difficulty breathing.  You are unable to swallow fluids, soft foods, or your saliva.  You have increased swelling in the throat.  Your sore throat does not get better in 7 days.  You have nausea and vomiting.  You have a fever or persistent symptoms for more than 2 3 days.  You have a fever and your symptoms suddenly get worse. MAKE SURE YOU:   Understand  these instructions.  Will watch your condition.  Will get help right away if you are not doing well or get worse. Document Released: 05/07/2004 Document Revised: 03/16/2012 Document Reviewed: 12/06/2011 [employer] Patient Information 2014 [employer].  

## 2013-06-15 LAB — CULTURE, GROUP A STREP: Organism ID, Bacteria: NORMAL

## 2013-09-25 ENCOUNTER — Telehealth: Payer: Self-pay | Admitting: Obstetrics and Gynecology

## 2013-09-25 NOTE — Telephone Encounter (Signed)
CONFIRMED APPT/[place]

## 2013-09-25 NOTE — Telephone Encounter (Signed)
Confirming pts appt °

## 2013-09-29 ENCOUNTER — Ambulatory Visit: Payer: 59 | Admitting: Obstetrics and Gynecology

## 2013-10-02 ENCOUNTER — Ambulatory Visit (INDEPENDENT_AMBULATORY_CARE_PROVIDER_SITE_OTHER): Payer: 59 | Admitting: Obstetrics and Gynecology

## 2013-10-02 ENCOUNTER — Encounter: Payer: Self-pay | Admitting: Obstetrics and Gynecology

## 2013-10-02 VITALS — BP 110/60 | HR 60 | Resp 18 | Ht 69.0 in | Wt 146.8 lb

## 2013-10-02 DIAGNOSIS — Z0000 Encounter for general adult medical examination without abnormal findings: Secondary | ICD-10-CM

## 2013-10-02 DIAGNOSIS — Z01419 Encounter for gynecological examination (general) (routine) without abnormal findings: Secondary | ICD-10-CM

## 2013-10-02 LAB — POCT URINALYSIS DIPSTICK
BILIRUBIN UA: NEGATIVE
Blood, UA: NEGATIVE
Glucose, UA: NEGATIVE
Ketones, UA: NEGATIVE
LEUKOCYTES UA: NEGATIVE
NITRITE UA: NEGATIVE
PH UA: 5
PROTEIN UA: NEGATIVE
UROBILINOGEN UA: NEGATIVE

## 2013-10-02 NOTE — Patient Instructions (Signed)

## 2013-10-02 NOTE — Progress Notes (Signed)
Patient ID: Jasmine Cochran, female   DOB: 05-20-1970, 43 y.o.   MRN: [id number] GYNECOLOGY VISIT  PCP:   Dr. Deniece Ree  Referring provider:   HPI: 43 y.o.   Single  Caucasian  female   G0P0000 with Patient's last menstrual period was 02/27/2012.   here for   AEX.  No hot flashes.   Has some urinary incontinence with running once.  Has not run since then.  Is doing a work up for "large red blood cells." Taking B12 and folate supplement.   Hgb:    PCP Urine:  Neg  GYNECOLOGIC HISTORY: Patient's last menstrual period was 02/27/2012. Sexually active:  no Partner preference: female Contraception: Hysterectomy   Menopausal hormone therapy: no DES exposure:  no  Blood transfusions:  no Sexually transmitted diseases:  no  GYN procedures and prior surgeries:  T-TLH/Bilateral salpingectomy 2013 for fibroids, colposcopy years ago in her 20's and cryotherapy.  Paps returned to normal. Last mammogram:   02-17-13 wnl:[facility]              Last pap and high risk HPV testing:   09-08-11 wnl History of abnormal pap smear: Yes, when patient in her 20's had colposcopy and cryotherapy.  Paps normal since.    OB History   Grav Para Term Preterm Abortions TAB SAB Ect Mult Living   0 0 0 0 0 0 0 0 0 0        LIFESTYLE: Exercise:  Walking, yoga and weights            Tobacco: no Alcohol:     1-3 drinks per week Drug use:  no  OTHER HEALTH MAINTENANCE: Tetanus/TDap:   2013 Gardisil:              n/a Influenza:           01/2013 Zostavax:            n/a  Bone density:      n/a Colonoscopy:    n/a  Cholesterol check:   09/2013 normal  Family History  Problem Relation Age of Onset  . Diabetes Father   . Hypertension Father   . Heart disease Father   . Depression Father   . Anxiety disorder Father   . Skin cancer Father   . Uterine cancer Mother   . Osteoporosis Mother   . Dementia Mother   . Multiple births Paternal Grandmother     twins  . Hypertension Paternal Grandfather    . Heart disease Paternal Grandfather   . Osteoporosis Maternal Grandmother     severe    There are no active problems to display for this patient.  Past Medical History  Diagnosis Date  . Anxiety   . Migraine with aura     1-2 per year  . PONV (postoperative nausea and vomiting)   . History of abnormal Pap smear     colpo, no treatment  . Cervical polyp 09/25/11    negative  . History of uterine fibroid 2013    82g, TLH    Past Surgical History  Procedure Laterality Date  . Septoplasty    . Robotic assisted total hysterectomy  03/22/2012    Procedure: ROBOTIC ASSISTED TOTAL HYSTERECTOMY;  Surgeon: Peri Maris, MD;  Location: [facility];  Service: Gynecology;  Laterality: N/A;  . Bilateral salpingectomy  03/22/2012    Procedure: BILATERAL SALPINGECTOMY;  Surgeon: Peri Maris, MD;  Location: [facility];  Service: Gynecology;  Laterality: Bilateral;  .  Cystoscopy  03/22/2012    Procedure: CYSTOSCOPY;  Surgeon: Peri Maris, MD;  Location: [facility];  Service: Gynecology;  Laterality: N/A;  . Colposcopy  years ago    no treatment  . Rhinoplasty  1994    ALLERGIES: Review of patient's allergies indicates no known allergies.  Current Outpatient Prescriptions  Medication Sig Dispense Refill  . ALPRAZolam (XANAX) 0.25 MG tablet Take 0.25 mg by mouth as needed. For stress / anxiety      . CALCIUM-VITAMIN D PO Take 1 tablet by mouth daily.      . Cholecalciferol (VITAMIN D PO) Take 1,000 Units by mouth once a week.      . cyanocobalamin 1000 MCG tablet Take 100 mcg by mouth daily.      . fluticasone (FLONASE) 50 MCG/ACT nasal spray Place 1 spray into both nostrils daily.      . folic acid (FOLVITE) 144 MCG tablet Take 400 mcg by mouth daily.      Marland Kitchen GLUCOSAMINE-CHONDROITIN-MSM PO Take 1 tablet by mouth daily. Pt says the strength is  Glucosamin 750mg /Condroitin 400mg /MSM 375mg       . loratadine (CLARITIN) 10 MG tablet Take 10 mg by mouth daily.      . Multiple  Vitamins-Minerals (MULTIVITAMIN WITH MINERALS) tablet Take 1 tablet by mouth daily.       No current facility-administered medications for this visit.     ROS:  Pertinent items are noted in HPI.  SOCIAL HISTORY:  Musician.   PHYSICAL EXAMINATION:    BP 110/60  Pulse 60  Resp 18  Ht 5\' 9"  (1.753 m)  Wt 146 lb 12.8 oz (66.588 kg)  BMI 21.67 kg/m2  LMP 02/27/2012   Wt Readings from Last 3 Encounters:  10/02/13 146 lb 12.8 oz (66.588 kg)  06/13/13 148 lb (67.132 kg)  09/14/12 146 lb (66.225 kg)     Ht Readings from Last 3 Encounters:  10/02/13 5\' 9"  (1.753 m)  06/13/13 5' 9.25" (1.759 m)  09/14/12 5\' 9"  (1.753 m)    General appearance: alert, cooperative and appears stated age Head: Normocephalic, without obvious abnormality, atraumatic Neck: no adenopathy, supple, symmetrical, trachea midline and thyroid not enlarged, symmetric, no tenderness/mass/nodules Lungs: clear to auscultation bilaterally Breasts: Inspection negative, No nipple retraction or dimpling, No nipple discharge or bleeding, No axillary or supraclavicular adenopathy, Normal to palpation without dominant masses Heart: regular rate and rhythm Abdomen: soft, non-tender; no masses,  no organomegaly Extremities: extremities normal, atraumatic, no cyanosis or edema Skin: Skin color, texture, turgor normal. No rashes or lesions Lymph nodes: Cervical, supraclavicular, and axillary nodes normal. No abnormal inguinal nodes palpated Neurologic: Grossly normal  Pelvic: External genitalia:  no lesions              Urethra:  normal appearing urethra with no masses, tenderness or lesions              Bartholins and Skenes: normal                 Vagina: normal appearing vagina with normal color and discharge, no lesions, moderate Kegel.               Cervix:  absent              Pap and high risk HPV testing done: no.            Bimanual Exam:  Uterus:   absent  Adnexa: normal  adnexa in size, nontender and no masses                                      Rectovaginal: Confirms                                      Anus:  normal sphincter tone, no lesions  ASSESSMENT  Normal gynecologic exam. Status post robotic hysterectomy with bilateral salpingectomy.  Mild genuine stress incontinence.   PLAN  Mammogram recommended yearly.  Pap smear and high risk HPV testing not indicated.  Counseled on self breast exam, Calcium and vitamin D intake, exercise. Kegel's reviewed.  Return annually or prn   An After Visit Summary was printed and given to the patient.

## 2014-03-20 ENCOUNTER — Other Ambulatory Visit: Payer: Self-pay

## 2014-03-20 DIAGNOSIS — Z1231 Encounter for screening mammogram for malignant neoplasm of breast: Secondary | ICD-10-CM

## 2014-03-30 ENCOUNTER — Ambulatory Visit
Admission: RE | Admit: 2014-03-30 | Discharge: 2014-03-30 | Disposition: A | Discharge status: Home / Self Care | Payer: 59 | Source: Ambulatory Visit

## 2014-03-30 DIAGNOSIS — Z1231 Encounter for screening mammogram for malignant neoplasm of breast: Secondary | ICD-10-CM

## 2014-10-08 ENCOUNTER — Encounter: Payer: Self-pay | Admitting: Obstetrics and Gynecology

## 2014-10-08 ENCOUNTER — Ambulatory Visit: Payer: 59 | Admitting: Obstetrics and Gynecology

## 2014-10-08 ENCOUNTER — Ambulatory Visit (INDEPENDENT_AMBULATORY_CARE_PROVIDER_SITE_OTHER): Payer: 59 | Admitting: Obstetrics and Gynecology

## 2014-10-08 VITALS — BP 104/70 | HR 72 | Resp 16 | Ht 69.0 in | Wt 146.0 lb

## 2014-10-08 DIAGNOSIS — Z0000 Encounter for general adult medical examination without abnormal findings: Secondary | ICD-10-CM | POA: Diagnosis not present

## 2014-10-08 DIAGNOSIS — Z01419 Encounter for gynecological examination (general) (routine) without abnormal findings: Secondary | ICD-10-CM | POA: Diagnosis not present

## 2014-10-08 DIAGNOSIS — R32 Unspecified urinary incontinence: Secondary | ICD-10-CM | POA: Diagnosis not present

## 2014-10-08 LAB — POCT URINALYSIS DIPSTICK
BILIRUBIN UA: NEGATIVE
Blood, UA: NEGATIVE
Glucose, UA: NEGATIVE
KETONES UA: NEGATIVE
LEUKOCYTES UA: NEGATIVE
Nitrite, UA: NEGATIVE
Protein, UA: NEGATIVE
Urobilinogen, UA: NEGATIVE
pH, UA: 5

## 2014-10-08 MED ORDER — CYCLOBENZAPRINE HCL 5 MG PO TABS: 5.0000 mg | ORAL_TABLET | Freq: Three times a day (TID) | ORAL | Status: DC | PRN

## 2014-10-08 NOTE — Progress Notes (Addendum)
Patient ID: Jasmine Cochran, female   DOB: [birth year], 44 y.o.   MRN: [id number] 44 y.o. G0P0000 Single Caucasian female here for annual exam.   Having a muscle spasm in her neck.  Slept on a train.  Just finished a big project at work and was doing a lot of intense computer work.  No hot flashes.   PCP:   Dr. Deniece Ree  Patient's last menstrual period was 02/27/2012.          Sexually active: No. female The current method of family planning is status post hysterectomy.    Exercising: Yes.    walking, yoga and strength training. Smoker:  no  Health Maintenance: Pap:  09-08-11 normal History of abnormal Pap:  Yes, 1995 hx colposcopy and cryotherapy to cervix. MMG:  03-30-14 [place] Cat:C/neg:[facility] Colonoscopy:  n/a BMD:   n/a  Result  n/a TDaP:  2013 Screening Labs:  Hb today: PCP, Urine today: Neg   reports that she has never smoked. She has never used smokeless tobacco. She reports that she drinks about 0.5 - 1.5 oz of alcohol per week. She reports that she does not use illicit drugs.  Past Medical History  Diagnosis Date  . Anxiety   . Migraine with aura     1-2 per year  . PONV (postoperative nausea and vomiting)   . History of abnormal Pap smear     colpo, no treatment  . Cervical polyp 09/25/11    negative  . History of uterine fibroid 2013    82g, TLH    Past Surgical History  Procedure Laterality Date  . Septoplasty    . Robotic assisted total hysterectomy  03/22/2012    Procedure: ROBOTIC ASSISTED TOTAL HYSTERECTOMY;  Surgeon: Peri Maris, MD;  Location: [facility];  Service: Gynecology;  Laterality: N/A;  . Bilateral salpingectomy  03/22/2012    Procedure: BILATERAL SALPINGECTOMY;  Surgeon: Peri Maris, MD;  Location: [facility];  Service: Gynecology;  Laterality: Bilateral;  . Cystoscopy  03/22/2012    Procedure: CYSTOSCOPY;  Surgeon: Peri Maris, MD;  Location: [facility];  Service: Gynecology;  Laterality: N/A;  . Colposcopy  years ago    no treatment   . Rhinoplasty  1994    Current Outpatient Prescriptions  Medication Sig Dispense Refill  . ALPRAZolam (XANAX) 0.25 MG tablet Take 0.25 mg by mouth as needed. For stress / anxiety    . CALCIUM-VITAMIN D PO Take 1 tablet by mouth daily.    . Cholecalciferol (VITAMIN D PO) Take 1,000 Units by mouth once a week.    . cyanocobalamin 1000 MCG tablet Take 100 mcg by mouth daily.    . fluticasone (FLONASE) 50 MCG/ACT nasal spray Place 1 spray into both nostrils daily.    . folic acid (FOLVITE) 854 MCG tablet Take 400 mcg by mouth daily.    Marland Kitchen GLUCOSAMINE-CHONDROITIN-MSM PO Take 1 tablet by mouth daily. Pt says the strength is  Glucosamin 750mg /Condroitin 400mg /MSM 375mg     . loratadine (CLARITIN) 10 MG tablet Take 10 mg by mouth daily.    . Multiple Vitamins-Minerals (MULTIVITAMIN WITH MINERALS) tablet Take 1 tablet by mouth daily.     No current facility-administered medications for this visit.    Family History  Problem Relation Age of Onset  . Diabetes Father   . Hypertension Father   . Heart disease Father   . Depression Father   . Anxiety disorder Father   . Skin cancer Father   . Uterine  cancer Mother   . Osteoporosis Mother   . Dementia Mother   . Multiple births Paternal Grandmother     twins  . Hypertension Paternal Grandfather   . Heart disease Paternal Grandfather   . Osteoporosis Maternal Grandmother     severe    ROS:  Pertinent items are noted in HPI.  Otherwise, a comprehensive ROS was negative.  Exam:   LMP 02/27/2012    General appearance: alert, cooperative and appears stated age Head: Normocephalic, without obvious abnormality, atraumatic Neck: no adenopathy, supple, symmetrical, trachea midline and thyroid normal to inspection and palpation Lungs: clear to auscultation bilaterally Breasts: normal appearance, no masses or tenderness, Inspection negative, No nipple retraction or dimpling, No nipple discharge or bleeding, No axillary or supraclavicular  adenopathy Heart: regular rate and rhythm Abdomen: soft, non-tender; bowel sounds normal; no masses,  no organomegaly Extremities: extremities normal, atraumatic, no cyanosis or edema Skin: Skin color, texture, turgor normal. No rashes or lesions Lymph nodes: Cervical, supraclavicular, and axillary nodes normal. No abnormal inguinal nodes palpated Neurologic: Grossly normal  Pelvic: External genitalia:  no lesions              Urethra:  normal appearing urethra with no masses, tenderness or lesions              Bartholins and Skenes: normal                 Vagina: normal appearing vagina with normal color and discharge, no lesions              Cervix: absent              Pap taken: No. Bimanual Exam:  Uterus:  uterus absent              Adnexa: no mass, fullness, tenderness              Rectovaginal: Yes.  .  Confirms.              Anus:  normal sphincter tone, no lesions  Chaperone was present for exam.  Assessment:   Well woman visit with normal exam. Remote hx of abnormal pap.  Neck muscle spasm.  Plan: Yearly mammogram recommended after age 80.  Recommended self breast exam.  Pap and HR HPV as above. Discussed Calcium, Vitamin D, regular exercise program including cardiovascular and weight bearing exercise. Labs performed.  No..   See orders. Refills given on medications.  Yes.  .  See orders.  Rx for Flexeril. Follow up annually and prn.   Addendum - patient with urinary incontinence.  Will refer for physical therapy with Ileana Roup.   After visit summary provided.

## 2014-10-08 NOTE — Patient Instructions (Signed)

## 2014-10-11 NOTE — Addendum Note (Signed)
Addended by: Yisroel Ramming, Dietrich Pates E on: 10/11/2014 08:15 AM   Modules accepted: Orders

## 2014-10-23 ENCOUNTER — Telehealth: Payer: Self-pay | Admitting: Obstetrics and Gynecology

## 2014-10-23 NOTE — Telephone Encounter (Signed)
Left voicemail regarding referral appointment. The information is listed below. Should the patient need to cancel or reschedule this appointment, please advise them to call the office they've been referred to in order to reschedule.  [facility] 2nd floor [place] [phone]  Scheduled for 11/05/14 @9am . Offices normally recommend arriving 15 minutes prior to appointment for paperwork, bring insurance card, id and list of medications.

## 2015-05-17 ENCOUNTER — Other Ambulatory Visit: Payer: Self-pay

## 2015-05-17 DIAGNOSIS — Z1231 Encounter for screening mammogram for malignant neoplasm of breast: Secondary | ICD-10-CM

## 2015-06-04 ENCOUNTER — Ambulatory Visit
Admission: RE | Admit: 2015-06-04 | Discharge: 2015-06-04 | Disposition: A | Discharge status: Home / Self Care | Payer: 59 | Source: Ambulatory Visit

## 2015-06-04 DIAGNOSIS — Z1231 Encounter for screening mammogram for malignant neoplasm of breast: Secondary | ICD-10-CM

## 2015-10-11 ENCOUNTER — Encounter: Payer: Self-pay | Admitting: Obstetrics and Gynecology

## 2015-10-11 ENCOUNTER — Ambulatory Visit (INDEPENDENT_AMBULATORY_CARE_PROVIDER_SITE_OTHER): Payer: 59 | Admitting: Obstetrics and Gynecology

## 2015-10-11 VITALS — BP 104/58 | HR 70 | Resp 16 | Ht 69.0 in | Wt 148.4 lb

## 2015-10-11 DIAGNOSIS — Z0000 Encounter for general adult medical examination without abnormal findings: Secondary | ICD-10-CM

## 2015-10-11 DIAGNOSIS — Z01419 Encounter for gynecological examination (general) (routine) without abnormal findings: Secondary | ICD-10-CM | POA: Diagnosis not present

## 2015-10-11 LAB — POCT URINALYSIS DIPSTICK
BILIRUBIN UA: NEGATIVE
Blood, UA: NEGATIVE
GLUCOSE UA: NEGATIVE
KETONES UA: NEGATIVE
LEUKOCYTES UA: NEGATIVE
Nitrite, UA: NEGATIVE
PROTEIN UA: NEGATIVE
Urobilinogen, UA: NEGATIVE
pH, UA: 8

## 2015-10-11 NOTE — Patient Instructions (Signed)
Your final pathology report from hysterectomy showed adenomyosis. This is endometriosis in the wall of the uterus. This is not a precancerous or cancerous condition.  You still have your ovaries and could develop endometriosis as long as you are not in menopause.  Josefa Half, MD     Health Maintenance, Female Adopting a healthy lifestyle and getting preventive care can go a long way to promote health and wellness. Talk with your health care provider about what schedule of regular examinations is right for you. This is a good chance for you to check in with your provider about disease prevention and staying healthy. In between checkups, there are plenty of things you can do on your own. Experts have done a lot of research about which lifestyle changes and preventive measures are most likely to keep you healthy. Ask your health care provider for more information. WEIGHT AND DIET  Eat a healthy diet  Be sure to include plenty of vegetables, fruits, low-fat dairy products, and lean protein.  Do not eat a lot of foods high in solid fats, added sugars, or salt.  Get regular exercise. This is one of the most important things you can do for your health.  Most adults should exercise for at least 150 minutes each week. The exercise should increase your heart rate and make you sweat (moderate-intensity exercise).  Most adults should also do strengthening exercises at least twice a week. This is in addition to the moderate-intensity exercise.  Maintain a healthy weight  Body mass index (BMI) is a measurement that can be used to identify possible weight problems. It estimates body fat based on height and weight. Your health care provider can help determine your BMI and help you achieve or maintain a healthy weight.  For females 61 years of age and older:   A BMI below 18.5 is considered underweight.  A BMI of 18.5 to 24.9 is normal.  A BMI of 25 to 29.9 is considered overweight.  A BMI of  30 and above is considered obese.  Watch levels of cholesterol and blood lipids  You should start having your blood tested for lipids and cholesterol at 47 years of age, then have this test every 5 years.  You may need to have your cholesterol levels checked more often if:  Your lipid or cholesterol levels are high.  You are older than 47 years of age.  You are at high risk for heart disease.  CANCER SCREENING   Lung Cancer  Lung cancer screening is recommended for adults 71-28 years old who are at high risk for lung cancer because of a history of smoking.  A yearly low-dose CT scan of the lungs is recommended for people who:  Currently smoke.  Have quit within the past 15 years.  Have at least a 30-pack-year history of smoking. A pack year is smoking an average of one pack of cigarettes a day for 1 year.  Yearly screening should continue until it has been 15 years since you quit.  Yearly screening should stop if you develop a health problem that would prevent you from having lung cancer treatment.  Breast Cancer  Practice breast self-awareness. This means understanding how your breasts normally appear and feel.  It also means doing regular breast self-exams. Let your health care provider know about any changes, no matter how small.  If you are in your 20s or 30s, you should have a clinical breast exam (CBE) by a health care provider every 1-3  years as part of a regular health exam.  If you are 80 or older, have a CBE every year. Also consider having a breast X-ray (mammogram) every year.  If you have a family history of breast cancer, talk to your health care provider about genetic screening.  If you are at high risk for breast cancer, talk to your health care provider about having an MRI and a mammogram every year.  Breast cancer gene (BRCA) assessment is recommended for women who have family members with BRCA-related cancers. BRCA-related cancers  include:  Breast.  Ovarian.  Tubal.  Peritoneal cancers.  Results of the assessment will determine the need for genetic counseling and BRCA1 and BRCA2 testing. Cervical Cancer Your health care provider may recommend that you be screened regularly for cancer of the pelvic organs (ovaries, uterus, and vagina). This screening involves a pelvic examination, including checking for microscopic changes to the surface of your cervix (Pap test). You may be encouraged to have this screening done every 3 years, beginning at age 21.  For women ages 22-65, health care providers may recommend pelvic exams and Pap testing every 3 years, or they may recommend the Pap and pelvic exam, combined with testing for human papilloma virus (HPV), every 5 years. Some types of HPV increase your risk of cervical cancer. Testing for HPV may also be done on women of any age with unclear Pap test results.  Other health care providers may not recommend any screening for nonpregnant women who are considered low risk for pelvic cancer and who do not have symptoms. Ask your health care provider if a screening pelvic exam is right for you.  If you have had past treatment for cervical cancer or a condition that could lead to cancer, you need Pap tests and screening for cancer for at least 20 years after your treatment. If Pap tests have been discontinued, your risk factors (such as having a new sexual partner) need to be reassessed to determine if screening should resume. Some women have medical problems that increase the chance of getting cervical cancer. In these cases, your health care provider may recommend more frequent screening and Pap tests. Colorectal Cancer  This type of cancer can be detected and often prevented.  Routine colorectal cancer screening usually begins at 47 years of age and continues through 47 years of age.  Your health care provider may recommend screening at an earlier age if you have risk factors for  colon cancer.  Your health care provider may also recommend using home test kits to check for hidden blood in the stool.  A small camera at the end of a tube can be used to examine your colon directly (sigmoidoscopy or colonoscopy). This is done to check for the earliest forms of colorectal cancer.  Routine screening usually begins at age 7.  Direct examination of the colon should be repeated every 5-10 years through 47 years of age. However, you may need to be screened more often if early forms of precancerous polyps or small growths are found. Skin Cancer  Check your skin from head to toe regularly.  Tell your health care provider about any new moles or changes in moles, especially if there is a change in a mole's shape or color.  Also tell your health care provider if you have a mole that is larger than the size of a pencil eraser.  Always use sunscreen. Apply sunscreen liberally and repeatedly throughout the day.  Protect yourself by wearing long  sleeves, pants, a wide-brimmed hat, and sunglasses whenever you are outside. HEART DISEASE, DIABETES, AND HIGH BLOOD PRESSURE   High blood pressure causes heart disease and increases the risk of stroke. High blood pressure is more likely to develop in:  People who have blood pressure in the high end of the normal range (130-139/85-89 mm Hg).  People who are overweight or obese.  People who are African American.  If you are 61-23 years of age, have your blood pressure checked every 3-5 years. If you are 72 years of age or older, have your blood pressure checked every year. You should have your blood pressure measured twice--once when you are at a hospital or clinic, and once when you are not at a hospital or clinic. Record the average of the two measurements. To check your blood pressure when you are not at a hospital or clinic, you can use:  An automated blood pressure machine at a pharmacy.  A home blood pressure monitor.  If you  are between 59 years and 56 years old, ask your health care provider if you should take aspirin to prevent strokes.  Have regular diabetes screenings. This involves taking a blood sample to check your fasting blood sugar level.  If you are at a normal weight and have a low risk for diabetes, have this test once every three years after 47 years of age.  If you are overweight and have a high risk for diabetes, consider being tested at a younger age or more often. PREVENTING INFECTION  Hepatitis B  If you have a higher risk for hepatitis B, you should be screened for this virus. You are considered at high risk for hepatitis B if:  You were born in a country where hepatitis B is common. Ask your health care provider which countries are considered high risk.  Your parents were born in a high-risk country, and you have not been immunized against hepatitis B (hepatitis B vaccine).  You have HIV or AIDS.  You use needles to inject street drugs.  You live with someone who has hepatitis B.  You have had sex with someone who has hepatitis B.  You get hemodialysis treatment.  You take certain medicines for conditions, including cancer, organ transplantation, and autoimmune conditions. Hepatitis C  Blood testing is recommended for:  Everyone born from [birth year] through 1967.  Anyone with known risk factors for hepatitis C. Sexually transmitted infections (STIs)  You should be screened for sexually transmitted infections (STIs) including gonorrhea and chlamydia if:  You are sexually active and are younger than 47 years of age.  You are older than 47 years of age and your health care provider tells you that you are at risk for this type of infection.  Your sexual activity has changed since you were last screened and you are at an increased risk for chlamydia or gonorrhea. Ask your health care provider if you are at risk.  If you do not have HIV, but are at risk, it may be recommended that you  take a prescription medicine daily to prevent HIV infection. This is called pre-exposure prophylaxis (PrEP). You are considered at risk if:  You are sexually active and do not regularly use condoms or know the HIV status of your partner(s).  You take drugs by injection.  You are sexually active with a partner who has HIV. Talk with your health care provider about whether you are at high risk of being infected with HIV. If you choose  to begin PrEP, you should first be tested for HIV. You should then be tested every 3 months for as long as you are taking PrEP.  PREGNANCY   If you are premenopausal and you may become pregnant, ask your health care provider about preconception counseling.  If you may become pregnant, take 400 to 800 micrograms (mcg) of folic acid every day.  If you want to prevent pregnancy, talk to your health care provider about birth control (contraception). OSTEOPOROSIS AND MENOPAUSE   Osteoporosis is a disease in which the bones lose minerals and strength with aging. This can result in serious bone fractures. Your risk for osteoporosis can be identified using a bone density scan.  If you are 9 years of age or older, or if you are at risk for osteoporosis and fractures, ask your health care provider if you should be screened.  Ask your health care provider whether you should take a calcium or vitamin D supplement to lower your risk for osteoporosis.  Menopause may have certain physical symptoms and risks.  Hormone replacement therapy may reduce some of these symptoms and risks. Talk to your health care provider about whether hormone replacement therapy is right for you.  HOME CARE INSTRUCTIONS   Schedule regular health, dental, and eye exams.  Stay current with your immunizations.   Do not use any tobacco products including cigarettes, chewing tobacco, or electronic cigarettes.  If you are pregnant, do not drink alcohol.  If you are breastfeeding, limit how  much and how often you drink alcohol.  Limit alcohol intake to no more than 1 drink per day for nonpregnant women. One drink equals 12 ounces of beer, 5 ounces of wine, or 1 ounces of hard liquor.  Do not use street drugs.  Do not share needles.  Ask your health care provider for help if you need support or information about quitting drugs.  Tell your health care provider if you often feel depressed.  Tell your health care provider if you have ever been abused or do not feel safe at home.   This information is not intended to replace advice given to you by your health care provider. Make sure you discuss any questions you have with your health care provider.   Document Released: 10/13/2010 Document Revised: 04/20/2014 Document Reviewed: 03/01/2013 Elsevier Interactive Patient Education [employer].

## 2015-10-11 NOTE — Progress Notes (Signed)
Patient ID: Jasmine Cochran, female   DOB: 07/07/1970, 45 y.o.   MRN: [id number] 45 y.o. G0P0000 Single Caucasian female here for annual exam.    Doing paddle board yoga.   Going to [place].   Just had labs with PCP.  Told they were normal.   PCP:  London Pepper, MD   Patient's last menstrual period was 02/27/2012.           Sexually active: No.female  The current method of family planning is status post hysterectomy.    Exercising: Yes.    walking, yoga and weights. Smoker:  no  Health Maintenance: Pap:  09-08-11 Neg History of abnormal Pap:  Yes, Hx cryotherapy 1995 MMG:  06-04-15 3D/Density C/Neg/BiRads1:[facility] Colonoscopy:  n/a BMD:   n/a  Result  n/a TDaP:  2013 Gardasil:   N/A   Screening Labs:  Hb today: PCP, Urine today: Neg   reports that she has never smoked. She has never used smokeless tobacco. She reports that she drinks about 0.6 - 1.2 oz of alcohol per week. She reports that she does not use illicit drugs.  Past Medical History  Diagnosis Date  . Anxiety   . Migraine with aura     1-2 per year  . PONV (postoperative nausea and vomiting)   . History of abnormal Pap smear     colpo, no treatment  . Cervical polyp 09/25/11    negative  . History of uterine fibroid 2013    82g, TLH    Past Surgical History  Procedure Laterality Date  . Septoplasty    . Robotic assisted total hysterectomy  03/22/2012    Procedure: ROBOTIC ASSISTED TOTAL HYSTERECTOMY; Adenomyosis, Surgeon: Peri Maris, MD;  Location: [facility];  Service: Gynecology;  Laterality: N/A;  . Bilateral salpingectomy  03/22/2012    Procedure: BILATERAL SALPINGECTOMY;  Surgeon: Peri Maris, MD;  Location: [facility];  Service: Gynecology;  Laterality: Bilateral;  . Cystoscopy  03/22/2012    Procedure: CYSTOSCOPY;  Surgeon: Peri Maris, MD;  Location: [facility];  Service: Gynecology;  Laterality: N/A;  . Colposcopy  years ago    no treatment  . Rhinoplasty  1994    Current  Outpatient Prescriptions  Medication Sig Dispense Refill  . ALPRAZolam (XANAX) 0.25 MG tablet Take 0.25 mg by mouth as needed. For stress / anxiety    . CALCIUM-VITAMIN D PO Take 1 tablet by mouth daily.    . Cholecalciferol (VITAMIN D PO) Take 1,000 Units by mouth once a week.    . cyanocobalamin 1000 MCG tablet Take 100 mcg by mouth daily.    . cyclobenzaprine (FLEXERIL) 5 MG tablet Take 1 tablet (5 mg total) by mouth 3 (three) times daily as needed for muscle spasms. 20 tablet 0  . fluticasone (FLONASE) 50 MCG/ACT nasal spray Place 1 spray into both nostrils daily.    . folic acid (FOLVITE) A999333 MCG tablet Take 400 mcg by mouth daily.    Marland Kitchen GLUCOSAMINE-CHONDROITIN-MSM PO Take 1 tablet by mouth daily. Pt says the strength is  Glucosamin 750mg /Condroitin 400mg /MSM 375mg     . loratadine (CLARITIN) 10 MG tablet Take 10 mg by mouth daily.    . Multiple Vitamins-Minerals (MULTIVITAMIN WITH MINERALS) tablet Take 1 tablet by mouth daily.     No current facility-administered medications for this visit.    Family History  Problem Relation Age of Onset  . Diabetes Father   . Hypertension Father   . Heart disease Father   .  Depression Father   . Anxiety disorder Father   . Skin cancer Father   . Uterine cancer Mother     sarcoma  . Osteoporosis Mother   . Dementia Mother   . Multiple births Paternal Grandmother     twins  . Hypertension Paternal Grandfather   . Heart disease Paternal Grandfather   . Osteoporosis Maternal Grandmother     severe    ROS:  Pertinent items are noted in HPI.  Otherwise, a comprehensive ROS was negative.  Exam:   BP 104/58 mmHg  Pulse 70  Resp 16  Ht 5\' 9"  (1.753 m)  Wt 148 lb 6.4 oz (67.314 kg)  BMI 21.90 kg/m2  LMP 02/27/2012    General appearance: alert, cooperative and appears stated age Head: Normocephalic, without obvious abnormality, atraumatic Neck: no adenopathy, supple, symmetrical, trachea midline and thyroid normal to inspection and  palpation Lungs: clear to auscultation bilaterally Breasts: normal appearance, no masses or tenderness, Inspection negative, No nipple retraction or dimpling, No nipple discharge or bleeding, No axillary or supraclavicular adenopathy Heart: regular rate and rhythm Abdomen  soft, non-tender; no masses, no organomegaly Extremities: extremities normal, atraumatic, no cyanosis or edema Skin: Skin color, texture, turgor normal. No rashes or lesions Lymph nodes: Cervical, supraclavicular, and axillary nodes normal. No abnormal inguinal nodes palpated Neurologic: Grossly normal  Pelvic: External genitalia:  no lesions              Urethra:  normal appearing urethra with no masses, tenderness or lesions              Bartholins and Skenes: normal                 Vagina: normal appearing vagina with normal color and discharge, no lesions              Cervix: absent              Pap taken: No. Bimanual Exam:  Uterus:  uterus absent              Adnexa: normal adnexa and no mass, fullness, tenderness              Rectal exam: Yes.  .  Confirms.              Anus:  normal sphincter tone, no lesions  Chaperone was present for exam.  Assessment:   Well woman visit with normal exam. Status post robotic TLH and bilateral salpingectomy.  Adenomyosis.  Ovaries remain.  FH of sarcoma.  Remote hx of abnormal pap.  Plan: Yearly mammogram recommended after age 33.  Recommended self breast exam.  Pap and HR HPV as above. Recommendations for Calcium, Vitamin D, regular exercise program including cardiovascular and weight bearing exercise. Labs performed.  No..   See orders. Prescription medication(s) given.  No..  See orders. Follow up annually and prn.       After visit summary provided.

## 2016-05-19 ENCOUNTER — Other Ambulatory Visit: Payer: Self-pay | Admitting: Family Medicine

## 2016-05-19 DIAGNOSIS — Z1231 Encounter for screening mammogram for malignant neoplasm of breast: Secondary | ICD-10-CM

## 2016-06-16 ENCOUNTER — Ambulatory Visit
Admission: RE | Admit: 2016-06-16 | Discharge: 2016-06-16 | Disposition: A | Discharge status: Home / Self Care | Payer: 59 | Source: Ambulatory Visit | Attending: Family Medicine | Admitting: Family Medicine

## 2016-06-16 DIAGNOSIS — Z1231 Encounter for screening mammogram for malignant neoplasm of breast: Secondary | ICD-10-CM | POA: Diagnosis not present

## 2016-07-09 DIAGNOSIS — J209 Acute bronchitis, unspecified: Secondary | ICD-10-CM | POA: Diagnosis not present

## 2016-09-22 DIAGNOSIS — D7589 Other specified diseases of blood and blood-forming organs: Secondary | ICD-10-CM | POA: Diagnosis not present

## 2016-09-22 DIAGNOSIS — Z0000 Encounter for general adult medical examination without abnormal findings: Secondary | ICD-10-CM | POA: Diagnosis not present

## 2016-10-15 ENCOUNTER — Ambulatory Visit: Payer: 59 | Admitting: Obstetrics and Gynecology

## 2016-10-15 NOTE — Progress Notes (Deleted)
46 y.o. G0P0000 Single Caucasian female here for annual exam.    PCP:     Patient's last menstrual period was 02/27/2012.           Sexually active: {yes no:314532}  The current method of family planning is status post hysterectomy.    Exercising: {yes no:314532}  {types:19826} Smoker:  no  Health Maintenance: Pap:  09-08-11 Neg History of abnormal Pap:  Yes, Hx cryotherapy 1995 MMG:  06/16/16 BIRADS 1 negative/density c Colonoscopy:  n/a BMD:   n/a  Result  n/a TDaP:  2013 Gardasil:   N/A HIV: Hep C: Screening Labs:  Hb today: ***, Urine today: ***   reports that she has never smoked. She has never used smokeless tobacco. She reports that she drinks about 0.6 - 1.2 oz of alcohol per week . She reports that she does not use drugs.  Past Medical History:  Diagnosis Date  . Anxiety   . Cervical polyp 09/25/11   negative  . History of abnormal Pap smear    colpo, no treatment  . History of uterine fibroid 2013   82g, TLH  . Migraine with aura    1-2 per year  . PONV (postoperative nausea and vomiting)     Past Surgical History:  Procedure Laterality Date  . BILATERAL SALPINGECTOMY  03/22/2012   Procedure: BILATERAL SALPINGECTOMY;  Surgeon: Peri Maris, MD;  Location: [facility];  Service: Gynecology;  Laterality: Bilateral;  . COLPOSCOPY  years ago   no treatment  . CYSTOSCOPY  03/22/2012   Procedure: CYSTOSCOPY;  Surgeon: Peri Maris, MD;  Location: [facility];  Service: Gynecology;  Laterality: N/A;  . RHINOPLASTY  1994  . ROBOTIC ASSISTED TOTAL HYSTERECTOMY  03/22/2012   Procedure: ROBOTIC ASSISTED TOTAL HYSTERECTOMY; Adenomyosis, Surgeon: Peri Maris, MD;  Location: [facility];  Service: Gynecology;  Laterality: N/A;  . SEPTOPLASTY      Current Outpatient Prescriptions  Medication Sig Dispense Refill  . ALPRAZolam (XANAX) 0.25 MG tablet Take 0.25 mg by mouth as needed. For stress / anxiety    . CALCIUM-VITAMIN D PO Take 1 tablet by mouth daily.    .  Cholecalciferol (VITAMIN D PO) Take 1,000 Units by mouth once a week.    . cyanocobalamin 1000 MCG tablet Take 100 mcg by mouth daily.    . cyclobenzaprine (FLEXERIL) 5 MG tablet Take 1 tablet (5 mg total) by mouth 3 (three) times daily as needed for muscle spasms. 20 tablet 0  . fluticasone (FLONASE) 50 MCG/ACT nasal spray Place 1 spray into both nostrils daily.    . folic acid (FOLVITE) 026 MCG tablet Take 400 mcg by mouth daily.    Marland Kitchen GLUCOSAMINE-CHONDROITIN-MSM PO Take 1 tablet by mouth daily. Pt says the strength is  Glucosamin 750mg /Condroitin 400mg /MSM 375mg     . loratadine (CLARITIN) 10 MG tablet Take 10 mg by mouth daily.    . Multiple Vitamins-Minerals (MULTIVITAMIN WITH MINERALS) tablet Take 1 tablet by mouth daily.     No current facility-administered medications for this visit.     Family History  Problem Relation Age of Onset  . Diabetes Father   . Hypertension Father   . Heart disease Father   . Depression Father   . Anxiety disorder Father   . Skin cancer Father   . Uterine cancer Mother        sarcoma  . Osteoporosis Mother   . Dementia Mother   . Multiple births Paternal Grandmother  twins  . Hypertension Paternal Grandfather   . Heart disease Paternal Grandfather   . Osteoporosis Maternal Grandmother        severe    ROS:  Pertinent items are noted in HPI.  Otherwise, a comprehensive ROS was negative.  Exam:   LMP 02/27/2012     General appearance: alert, cooperative and appears stated age Head: Normocephalic, without obvious abnormality, atraumatic Neck: no adenopathy, supple, symmetrical, trachea midline and thyroid normal to inspection and palpation Lungs: clear to auscultation bilaterally Breasts: normal appearance, no masses or tenderness, No nipple retraction or dimpling, No nipple discharge or bleeding, No axillary or supraclavicular adenopathy Heart: regular rate and rhythm Abdomen: soft, non-tender; no masses, no organomegaly Extremities:  extremities normal, atraumatic, no cyanosis or edema Skin: Skin color, texture, turgor normal. No rashes or lesions Lymph nodes: Cervical, supraclavicular, and axillary nodes normal. No abnormal inguinal nodes palpated Neurologic: Grossly normal  Pelvic: External genitalia:  no lesions              Urethra:  normal appearing urethra with no masses, tenderness or lesions              Bartholins and Skenes: normal                 Vagina: normal appearing vagina with normal color and discharge, no lesions              Cervix: no lesions              Pap taken: {yes no:314532} Bimanual Exam:  Uterus:  normal size, contour, position, consistency, mobility, non-tender              Adnexa: no mass, fullness, tenderness              Rectal exam: {yes no:314532}.  Confirms.              Anus:  normal sphincter tone, no lesions  Chaperone was present for exam.  Assessment:   Well woman visit with normal exam.   Plan: Mammogram screening discussed. Recommended self breast awareness. Pap and HR HPV as above. Guidelines for Calcium, Vitamin D, regular exercise program including cardiovascular and weight bearing exercise.   Follow up annually and prn.   Additional counseling given.  {yes [id number]. _______ minutes face to face time of which over 50% was spent in counseling.    After visit summary provided.

## 2016-10-19 ENCOUNTER — Ambulatory Visit: Payer: 59 | Admitting: Obstetrics and Gynecology

## 2016-11-02 ENCOUNTER — Encounter: Payer: Self-pay | Admitting: Obstetrics and Gynecology

## 2016-11-02 ENCOUNTER — Ambulatory Visit (INDEPENDENT_AMBULATORY_CARE_PROVIDER_SITE_OTHER): Payer: 59 | Admitting: Obstetrics and Gynecology

## 2016-11-02 VITALS — BP 110/60 | HR 72 | Resp 16 | Ht 69.0 in | Wt 146.0 lb

## 2016-11-02 DIAGNOSIS — Z01419 Encounter for gynecological examination (general) (routine) without abnormal findings: Secondary | ICD-10-CM

## 2016-11-02 MED ORDER — CYCLOBENZAPRINE HCL 5 MG PO TABS: 5.0000 mg | ORAL_TABLET | Freq: Three times a day (TID) | ORAL | 0 refills | Status: DC | PRN

## 2016-11-02 NOTE — Patient Instructions (Signed)

## 2016-11-02 NOTE — Progress Notes (Signed)
46 y.o. G0P0000 Single Caucasian female here for annual exam.    Thinks she slept wrong and the neck muscles had spasms.  Flexeril helped.   No hot flashes or night sweats.  No vaginal dryness.  Labs through PCP.   Dealing with elderly parents.  They live in [place]  Her mother has cognitive decline.  PCP: Dr. Deniece Ree  [facility]  Patient's last menstrual period was 02/27/2012.           Sexually active: No.  The current method of family planning is status post hysterectomy.    Exercising: Yes.    walking, yoga, paddle boarding Smoker:  no  Health Maintenance: Pap:  09-08-11 Neg History of abnormal Pap:  Yes, Hx cryotherapy 1995.  Final pathology on cervix was benign.  MMG:  06/16/16 BIRADS 1 negative/density c Colonoscopy:  n/a BMD:   n/a  Result  n/a TDaP:  2013 Gardasil:   N/A Screening Labs:  PCP takes care of labs    reports that she has never smoked. She has never used smokeless tobacco. She reports that she drinks about 0.6 - 1.2 oz of alcohol per week . She reports that she does not use drugs.  Past Medical History:  Diagnosis Date  . Anxiety   . Cervical polyp 09/25/11   negative  . History of abnormal Pap smear    colpo, no treatment  . History of uterine fibroid 2013   82g, TLH  . Migraine with aura    1-2 per year  . PONV (postoperative nausea and vomiting)     Past Surgical History:  Procedure Laterality Date  . BILATERAL SALPINGECTOMY  03/22/2012   Procedure: BILATERAL SALPINGECTOMY;  Surgeon: Peri Maris, MD;  Location: [facility];  Service: Gynecology;  Laterality: Bilateral;  . COLPOSCOPY  years ago   no treatment  . CYSTOSCOPY  03/22/2012   Procedure: CYSTOSCOPY;  Surgeon: Peri Maris, MD;  Location: [facility];  Service: Gynecology;  Laterality: N/A;  . RHINOPLASTY  1994  . ROBOTIC ASSISTED TOTAL HYSTERECTOMY  03/22/2012   Procedure: ROBOTIC ASSISTED TOTAL HYSTERECTOMY; Adenomyosis, Surgeon: Peri Maris, MD;  Location: [facility];   Service: Gynecology;  Laterality: N/A;  . SEPTOPLASTY      Current Outpatient Prescriptions  Medication Sig Dispense Refill  . ALPRAZolam (XANAX) 0.25 MG tablet Take 0.25 mg by mouth as needed. For stress / anxiety    . CALCIUM-VITAMIN D PO Take 1 tablet by mouth daily.    . Cholecalciferol (VITAMIN D PO) Take 1,000 Units by mouth once a week.    . cyanocobalamin 1000 MCG tablet Take 100 mcg by mouth daily.    . cyclobenzaprine (FLEXERIL) 5 MG tablet Take 1 tablet (5 mg total) by mouth 3 (three) times daily as needed for muscle spasms. 20 tablet 0  . fluticasone (FLONASE) 50 MCG/ACT nasal spray Place 1 spray into both nostrils daily.    . folic acid (FOLVITE) 161 MCG tablet Take 400 mcg by mouth daily.    Marland Kitchen GLUCOSAMINE-CHONDROITIN-MSM PO Take 1 tablet by mouth daily. Pt says the strength is  Glucosamin 750mg /Condroitin 400mg /MSM 375mg     . loratadine (CLARITIN) 10 MG tablet Take 10 mg by mouth daily.    . Multiple Vitamins-Minerals (MULTIVITAMIN WITH MINERALS) tablet Take 1 tablet by mouth daily.     No current facility-administered medications for this visit.     Family History  Problem Relation Age of Onset  . Diabetes Father   . Hypertension  Father   . Heart disease Father   . Depression Father   . Anxiety disorder Father   . Skin cancer Father   . Uterine cancer Mother        sarcoma  . Osteoporosis Mother   . Dementia Mother   . Multiple births Paternal Grandmother        twins  . Hypertension Paternal Grandfather   . Heart disease Paternal Grandfather   . Osteoporosis Maternal Grandmother        severe    ROS:  Pertinent items are noted in HPI.  Otherwise, a comprehensive ROS was negative.  Exam:   BP 110/60 (BP Location: Right Arm, Patient Position: Sitting, Cuff Size: Normal)   Pulse 72   Resp 16   Ht 5\' 9"  (1.753 m)   Wt 146 lb (66.2 kg)   LMP 02/27/2012   BMI 21.56 kg/m     General appearance: alert, cooperative and appears stated age Head: Normocephalic,  without obvious abnormality, atraumatic Neck: no adenopathy, supple, symmetrical, trachea midline and thyroid normal to inspection and palpation Lungs: clear to auscultation bilaterally Breasts: normal appearance, no masses or tenderness, No nipple retraction or dimpling, No nipple discharge or bleeding, No axillary or supraclavicular adenopathy Heart: regular rate and rhythm Abdomen: soft, non-tender; no masses, no organomegaly Extremities: extremities normal, atraumatic, no cyanosis or edema Skin: Skin color, texture, turgor normal. No rashes or lesions Lymph nodes: Cervical, supraclavicular, and axillary nodes normal. No abnormal inguinal nodes palpated Neurologic: Grossly normal  Pelvic: External genitalia:  no lesions              Urethra:  normal appearing urethra with no masses, tenderness or lesions              Bartholins and Skenes: normal                 Vagina: normal appearing vagina with normal color and discharge, no lesions              Cervix:  Absent.              Pap taken: No. Bimanual Exam:  Uterus:  Absent.              Adnexa: no mass, fullness, tenderness              Rectal exam: Yes.  .  Confirms.              Anus:  normal sphincter tone, no lesions  Chaperone was present for exam.  Assessment:   Well woman visit with normal exam. Status post robotic TLH with bilateral salpingectomy.  Adenomyosis.  Ovaries remain.  FH sarcoma.  Remote hx abnormal paps.  Final pathology on cervix from hysterectomy was benign.  Neck spasms.  Used expired Flexeril last week.  FH osteoporosis. Mother with cognitive decline.   Plan: Mammogram screening discussed. Recommended self breast awareness. Pap and HR HPV as above. Guidelines for Calcium, Vitamin D, regular exercise program including cardiovascular and weight bearing exercise. Refill of Flexeril.  I discussed new colonoscopy guidelines.  I gave information to patient about the [employer] of  [place]. Follow up annually and prn.    After visit summary provided.

## 2017-07-28 ENCOUNTER — Other Ambulatory Visit: Payer: Self-pay | Admitting: Family Medicine

## 2017-07-28 DIAGNOSIS — Z1231 Encounter for screening mammogram for malignant neoplasm of breast: Secondary | ICD-10-CM

## 2017-08-25 ENCOUNTER — Ambulatory Visit
Admission: RE | Admit: 2017-08-25 | Discharge: 2017-08-25 | Disposition: A | Discharge status: Home / Self Care | Payer: 59 | Source: Ambulatory Visit | Attending: Family Medicine | Admitting: Family Medicine

## 2017-08-25 DIAGNOSIS — Z1231 Encounter for screening mammogram for malignant neoplasm of breast: Secondary | ICD-10-CM | POA: Diagnosis not present

## 2017-09-30 DIAGNOSIS — Z136 Encounter for screening for cardiovascular disorders: Secondary | ICD-10-CM | POA: Diagnosis not present

## 2017-09-30 DIAGNOSIS — Z131 Encounter for screening for diabetes mellitus: Secondary | ICD-10-CM | POA: Diagnosis not present

## 2017-09-30 DIAGNOSIS — Z0000 Encounter for general adult medical examination without abnormal findings: Secondary | ICD-10-CM | POA: Diagnosis not present

## 2017-09-30 DIAGNOSIS — D7589 Other specified diseases of blood and blood-forming organs: Secondary | ICD-10-CM | POA: Diagnosis not present

## 2017-10-07 DIAGNOSIS — M79672 Pain in left foot: Secondary | ICD-10-CM | POA: Diagnosis not present

## 2017-11-09 NOTE — Progress Notes (Signed)
47 y.o. G0P0000 Single Caucasian female here for annual exam.    Not hot flashes, night sweats, or vaginal dryness.   Having feel issues.  Having imflammed bursas. On NSAIDs prn now. 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   PCP:   London Pepper, M.D.  Patient's last menstrual period was 02/27/2012.           Sexually active: No.  The current method of family planning is status post hysterectomy.    Exercising: Yes.    yoga, hiking Smoker:  no  Health Maintenance: Pap:  09-08-11 Neg History of abnormal Pap:  Yes, Hx cryotherapy 1995.  Final pathology on cervix was benign.  MMG:  08/25/2017 BIRADS 1 negative Colonoscopy:  n/a BMD:   n/a  Result  n/a TDaP:  2013 Gardasil:   N/A Screening Labs:  PCP takes care of labs   reports that she has never smoked. She has never used smokeless tobacco. She reports that she drinks about 0.6 - 1.2 oz of alcohol per week. She reports that she does not use drugs.  Past Medical History:  Diagnosis Date  . Anxiety   . Cervical polyp 09/25/11   negative  . History of abnormal Pap smear    colpo, no treatment  . History of uterine fibroid 2013   82g, TLH  . Migraine with aura    1-2 per year  . PONV (postoperative nausea and vomiting)     Past Surgical History:  Procedure Laterality Date  . BILATERAL SALPINGECTOMY  03/22/2012   Procedure: BILATERAL SALPINGECTOMY;  Surgeon: Peri Maris, MD;  Location: [facility];  Service: Gynecology;  Laterality: Bilateral;  . COLPOSCOPY  years ago   no treatment  . CYSTOSCOPY  03/22/2012   Procedure: CYSTOSCOPY;  Surgeon: Peri Maris, MD;  Location: [facility];  Service: Gynecology;  Laterality: N/A;  . RHINOPLASTY  1994  . ROBOTIC ASSISTED TOTAL HYSTERECTOMY  03/22/2012   Procedure: ROBOTIC ASSISTED TOTAL HYSTERECTOMY; Adenomyosis, Surgeon: Peri Maris, MD;  Location: [facility];  Service: Gynecology;  Laterality: N/A;  . SEPTOPLASTY      Current Outpatient Medications   Medication Sig Dispense Refill  . ALPRAZolam (XANAX) 0.25 MG tablet Take 0.25 mg by mouth as needed. For stress / anxiety    . CALCIUM-VITAMIN D PO Take 1 tablet by mouth daily.    . Cholecalciferol (VITAMIN D PO) Take 1,000 Units by mouth once a week.    . cyanocobalamin 1000 MCG tablet Take 100 mcg by mouth daily.    . cyclobenzaprine (FLEXERIL) 5 MG tablet Take 1 tablet (5 mg total) by mouth 3 (three) times daily as needed for muscle spasms. 20 tablet 0  . fluticasone (FLONASE) 50 MCG/ACT nasal spray Place 1 spray into both nostrils daily.    . folic acid (FOLVITE) 001 MCG tablet Take 400 mcg by mouth daily.    Marland Kitchen GLUCOSAMINE-CHONDROITIN-MSM PO Take 1 tablet by mouth daily. Pt says the strength is  Glucosamin 750mg /Condroitin 400mg /MSM 375mg     . loratadine (CLARITIN) 10 MG tablet Take 10 mg by mouth daily.    . meloxicam (MOBIC) 15 MG tablet Take 15 mg by mouth daily as needed.    . Multiple Vitamins-Minerals (MULTIVITAMIN WITH MINERALS) tablet Take 1 tablet by mouth daily.     No current facility-administered medications for this visit.     Family History  Problem Relation Age of Onset  . Diabetes Father   . Hypertension Father   .  Heart disease Father   . Depression Father   . Anxiety disorder Father   . Skin cancer Father   . Uterine cancer Mother        sarcoma  . Osteoporosis Mother   . Dementia Mother   . Multiple births Paternal Grandmother        twins  . Hypertension Paternal Grandfather   . Heart disease Paternal Grandfather   . Osteoporosis Maternal Grandmother        severe    Review of Systems  Constitutional: Negative.   HENT: Negative.   Eyes: Negative.   Respiratory: Negative.   Cardiovascular: Negative.   Gastrointestinal: Negative.   Endocrine: Negative.   Genitourinary: Negative.   Musculoskeletal: Negative.   Skin: Negative.   Allergic/Immunologic: Negative.   Neurological: Negative.   Hematological: Negative.   Psychiatric/Behavioral:  Negative.     Exam:   BP 110/78 (BP Location: Right Arm, Patient Position: Sitting)   Pulse 76   Ht 5\' 9"  (1.753 m)   Wt 147 lb (66.7 kg)   LMP 02/27/2012   BMI 21.71 kg/m     General appearance: alert, cooperative and appears stated age Head: Normocephalic, without obvious abnormality, atraumatic Neck: no adenopathy, supple, symmetrical, trachea midline and thyroid normal to inspection and palpation Lungs: clear to auscultation bilaterally Breasts: normal appearance, no masses or tenderness, No nipple retraction or dimpling, No nipple discharge or bleeding, No axillary or supraclavicular adenopathy Heart: regular rate and rhythm Abdomen: soft, non-tender; no masses, no organomegaly Extremities: extremities normal, atraumatic, no cyanosis or edema Skin: Skin color, texture, turgor normal. No rashes or lesions Lymph nodes: Cervical, supraclavicular, and axillary nodes normal. No abnormal inguinal nodes palpated Neurologic: Grossly normal  Pelvic: External genitalia:  no lesions              Urethra:  normal appearing urethra with no masses, tenderness or lesions              Bartholins and Skenes: normal                 Vagina: normal appearing vagina with normal color and discharge, no lesions              Cervix:  absent              Pap taken: No. Bimanual Exam:  Uterus:   absent              Adnexa: no mass, fullness, tenderness              Rectal exam: Yes.  .  Confirms.              Anus:  normal sphincter tone, no lesions  Chaperone was present for exam.  Assessment:   Well woman visit with normal exam. Status post robotic TLH with bilateral salpingectomy.  Adenomyosis.  Ovaries remain.  FH sarcoma.  Remote hx abnormal paps.  Final pathology on cervix from hysterectomy was benign.  FH osteoporosis.  Plan: Mammogram screening. Recommended self breast awareness. Pap and HR HPV as above. Guidelines for Calcium, Vitamin D, regular exercise program including  cardiovascular and weight bearing exercise. IFOB. Labs with PCP.  Follow up annually and prn.  After visit summary provided.

## 2017-11-10 ENCOUNTER — Ambulatory Visit: Payer: 59 | Admitting: Obstetrics and Gynecology

## 2017-11-10 ENCOUNTER — Other Ambulatory Visit: Payer: Self-pay

## 2017-11-10 ENCOUNTER — Encounter: Payer: Self-pay | Admitting: Obstetrics and Gynecology

## 2017-11-10 VITALS — BP 110/78 | HR 76 | Ht 69.0 in | Wt 147.0 lb

## 2017-11-10 DIAGNOSIS — Z1211 Encounter for screening for malignant neoplasm of colon: Secondary | ICD-10-CM | POA: Diagnosis not present

## 2017-11-10 DIAGNOSIS — Z01419 Encounter for gynecological examination (general) (routine) without abnormal findings: Secondary | ICD-10-CM

## 2017-11-10 NOTE — Patient Instructions (Signed)

## 2017-12-28 DIAGNOSIS — R309 Painful micturition, unspecified: Secondary | ICD-10-CM | POA: Diagnosis not present

## 2017-12-28 DIAGNOSIS — N390 Urinary tract infection, site not specified: Secondary | ICD-10-CM | POA: Diagnosis not present

## 2017-12-28 DIAGNOSIS — Z23 Encounter for immunization: Secondary | ICD-10-CM | POA: Diagnosis not present

## 2018-03-30 DIAGNOSIS — H5203 Hypermetropia, bilateral: Secondary | ICD-10-CM | POA: Diagnosis not present

## 2018-04-13 DIAGNOSIS — M7752 Other enthesopathy of left foot: Secondary | ICD-10-CM

## 2018-04-13 DIAGNOSIS — M7751 Other enthesopathy of right foot: Secondary | ICD-10-CM

## 2018-04-13 HISTORY — DX: Other enthesopathy of left foot and ankle: M77.51

## 2018-04-13 HISTORY — DX: Other enthesopathy of left foot and ankle: M77.52

## 2018-05-10 DIAGNOSIS — H6980 Other specified disorders of Eustachian tube, unspecified ear: Secondary | ICD-10-CM | POA: Diagnosis not present

## 2018-05-10 DIAGNOSIS — H938X9 Other specified disorders of ear, unspecified ear: Secondary | ICD-10-CM | POA: Diagnosis not present

## 2018-05-25 DIAGNOSIS — S0501XA Injury of conjunctiva and corneal abrasion without foreign body, right eye, initial encounter: Secondary | ICD-10-CM | POA: Diagnosis not present

## 2018-06-07 DIAGNOSIS — H11121 Conjunctival concretions, right eye: Secondary | ICD-10-CM | POA: Diagnosis not present

## 2018-09-12 ENCOUNTER — Other Ambulatory Visit: Payer: Self-pay | Admitting: Family Medicine

## 2018-09-12 DIAGNOSIS — Z1231 Encounter for screening mammogram for malignant neoplasm of breast: Secondary | ICD-10-CM

## 2018-10-28 ENCOUNTER — Ambulatory Visit
Admission: RE | Admit: 2018-10-28 | Discharge: 2018-10-28 | Disposition: A | Discharge status: Home / Self Care | Payer: 59 | Source: Ambulatory Visit | Attending: Family Medicine | Admitting: Family Medicine

## 2018-10-28 ENCOUNTER — Other Ambulatory Visit: Payer: Self-pay

## 2018-10-28 DIAGNOSIS — Z1231 Encounter for screening mammogram for malignant neoplasm of breast: Secondary | ICD-10-CM

## 2018-11-25 ENCOUNTER — Ambulatory Visit: Payer: 59 | Admitting: Obstetrics and Gynecology

## 2018-11-29 ENCOUNTER — Other Ambulatory Visit: Payer: Self-pay

## 2018-11-30 NOTE — Progress Notes (Signed)
48 y.o. G0P0000 Single Caucasian female here for annual exam.    Noticing increased heat.  Wants testing for her hormone levels.   Has a new partner.  They did STD testing prior to becoming sexually active.   Having left shoulder muscle flare.  Thinks it is stress related.  Usually does massage for this.  She would like a muscle relaxer for this.   Working in Social research officer, government.  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  Teaching yoga.  She had blood work with her PCP.   PCP: London Pepper, MD    Patient's last menstrual period was 02/27/2012.           Sexually active: Yes.    The current method of family planning is status post hysterectomy.    Exercising: Yes.    walking and yoga Smoker:  no  Health Maintenance: Pap: 09-08-11 Neg History of abnormal Pap:  Yes, Hx cryotherapy 1995. Final pathology on cervix was benign.  MMG: 10-28-18 3D Neg/density C/BiRads1 Colonoscopy:  Has seen [facility] and will schedule BMD:   n/a  Result  n/a TDaP:  2013 Gardasil:   N/A HIV:12/2017 Neg Hep C:12/2017 Neg Screening Labs:  ---   reports that she has never smoked. She has never used smokeless tobacco. She reports current alcohol use of about 3.0 - 4.0 standard drinks of alcohol per week. She reports that she does not use drugs.  Past Medical History:  Diagnosis Date  . Anxiety   . Bursitis of both feet 2020  . Cervical polyp 09/25/11   negative  . History of abnormal Pap smear    colpo, no treatment  . History of uterine fibroid 2013   82g, TLH  . Migraine with aura    1-2 per year  . PONV (postoperative nausea and vomiting)     Past Surgical History:  Procedure Laterality Date  . BILATERAL SALPINGECTOMY  03/22/2012   Procedure: BILATERAL SALPINGECTOMY;  Surgeon: Peri Maris, MD;  Location: [facility];  Service: Gynecology;  Laterality: Bilateral;  . COLPOSCOPY  years ago   no treatment  . CYSTOSCOPY  03/22/2012   Procedure: CYSTOSCOPY;  Surgeon: Peri Maris,  MD;  Location: [facility];  Service: Gynecology;  Laterality: N/A;  . RHINOPLASTY  1994  . ROBOTIC ASSISTED TOTAL HYSTERECTOMY  03/22/2012   Procedure: ROBOTIC ASSISTED TOTAL HYSTERECTOMY; Adenomyosis, Surgeon: Peri Maris, MD;  Location: [facility];  Service: Gynecology;  Laterality: N/A;  . SEPTOPLASTY      Current Outpatient Medications  Medication Sig Dispense Refill  . ALPRAZolam (XANAX) 0.25 MG tablet Take 0.25 mg by mouth as needed. For stress / anxiety    . Cholecalciferol (VITAMIN D PO) Take 1,000 Units by mouth once a week.    . cyanocobalamin 1000 MCG tablet Take 100 mcg by mouth daily.    . cyclobenzaprine (FLEXERIL) 5 MG tablet Take 1 tablet (5 mg total) by mouth 3 (three) times daily as needed for muscle spasms. 20 tablet 0  . fluticasone (FLONASE) 50 MCG/ACT nasal spray Place 1 spray into both nostrils daily.    . folic acid (FOLVITE) 616 MCG tablet Take 400 mcg by mouth daily.    Marland Kitchen GLUCOSAMINE-CHONDROITIN-MSM PO Take 1 tablet by mouth daily. Pt says the strength is  Glucosamin 750mg /Condroitin 400mg /MSM 375mg     . loratadine (CLARITIN) 10 MG tablet Take 10 mg by mouth daily.    . meloxicam (MOBIC) 15 MG tablet Take 15 mg by mouth daily  as needed.    . Multiple Vitamins-Minerals (MULTIVITAMIN WITH MINERALS) tablet Take 1 tablet by mouth daily.     No current facility-administered medications for this visit.     Family History  Problem Relation Age of Onset  . Diabetes Father   . Hypertension Father   . Heart disease Father   . Depression Father   . Anxiety disorder Father   . Skin cancer Father   . Uterine cancer Mother        sarcoma  . Osteoporosis Mother   . Dementia Mother   . Multiple births Paternal Grandmother        twins  . Hypertension Paternal Grandfather   . Heart disease Paternal Grandfather   . Osteoporosis Maternal Grandmother        severe    Review of Systems  Musculoskeletal: Positive for neck pain (neck pain and spasms).       Shoulder pain   All other systems reviewed and are negative.   Exam:   BP 120/74   Pulse 88   Temp 98 F (36.7 C) (Temporal)   Resp 18   Ht 5' 8.5" (1.74 m)   Wt 154 lb 3.2 oz (69.9 kg)   LMP 02/27/2012   BMI 23.11 kg/m     General appearance: alert, cooperative and appears stated age Head: normocephalic, without obvious abnormality, atraumatic Neck: no adenopathy, supple, symmetrical, trachea midline and thyroid normal to inspection and palpation Lungs: clear to auscultation bilaterally Breasts: normal appearance, no masses or tenderness, No nipple retraction or dimpling, No nipple discharge or bleeding, No axillary adenopathy Heart: regular rate and rhythm Abdomen: soft, non-tender; no masses, no organomegaly Extremities: extremities normal, atraumatic, no cyanosis or edema Skin: skin color, texture, turgor normal. No rashes or lesions Lymph nodes: cervical, supraclavicular, and axillary nodes normal. Neurologic: grossly normal  Pelvic: External genitalia:  no lesions              No abnormal inguinal nodes palpated.              Urethra:  normal appearing urethra with no masses, tenderness or lesions              Bartholins and Skenes: normal                 Vagina: normal appearing vagina with normal color and discharge, no lesions              Cervix: absent              Pap taken: No. Bimanual Exam:  Uterus:   absent              Adnexa: no mass, fullness, tenderness              Rectal exam: Yes.  .  Confirms.              Anus:  normal sphincter tone, no lesions  Chaperone was present for exam.  Assessment:   Well woman visit with normal exam. Status post robotic TLH with bilateral salpingectomy. Adenomyosis. Ovaries remain.  FH sarcoma.  Remote hx abnormal paps. Final pathology on cervix from hysterectomy was benign.  FH osteoporosis. Menopause symptoms.  Muscle spasm.  Plan: Mammogram screening discussed. Self breast awareness reviewed. Pap and HR HPV as  above. Guidelines for Calcium, Vitamin D, regular exercise program including cardiovascular and weight bearing exercise. [place] and E2.  We discussed changes that can occur with menopause - increased risk  CVD, osteoporosis.  Brochure on menopause.  Refill of Flexeril to use prn.  Follow up annually and prn.    After visit summary provided.

## 2018-12-01 ENCOUNTER — Ambulatory Visit: Payer: 59 | Admitting: Obstetrics and Gynecology

## 2018-12-01 ENCOUNTER — Encounter: Payer: Self-pay | Admitting: Obstetrics and Gynecology

## 2018-12-01 ENCOUNTER — Other Ambulatory Visit: Payer: Self-pay

## 2018-12-01 ENCOUNTER — Ambulatory Visit (INDEPENDENT_AMBULATORY_CARE_PROVIDER_SITE_OTHER): Payer: 59 | Admitting: Obstetrics and Gynecology

## 2018-12-01 VITALS — BP 120/74 | HR 88 | Temp 98.0°F | Resp 18 | Ht 68.5 in | Wt 154.2 lb

## 2018-12-01 DIAGNOSIS — Z01419 Encounter for gynecological examination (general) (routine) without abnormal findings: Secondary | ICD-10-CM

## 2018-12-01 DIAGNOSIS — N951 Menopausal and female climacteric states: Secondary | ICD-10-CM

## 2018-12-01 MED ORDER — CYCLOBENZAPRINE HCL 5 MG PO TABS: 5.0000 mg | ORAL_TABLET | Freq: Three times a day (TID) | ORAL | 0 refills | Status: DC | PRN

## 2018-12-01 NOTE — Patient Instructions (Signed)

## 2018-12-02 LAB — ESTRADIOL: Estradiol: 144 pg/mL

## 2018-12-02 LAB — FOLLICLE STIMULATING HORMONE: FSH: 5.4 m[IU]/mL

## 2019-10-24 ENCOUNTER — Other Ambulatory Visit: Payer: Self-pay | Admitting: Family Medicine

## 2019-10-24 DIAGNOSIS — Z1231 Encounter for screening mammogram for malignant neoplasm of breast: Secondary | ICD-10-CM

## 2019-10-31 ENCOUNTER — Other Ambulatory Visit: Payer: Self-pay

## 2019-10-31 ENCOUNTER — Ambulatory Visit
Admission: RE | Admit: 2019-10-31 | Discharge: 2019-10-31 | Disposition: A | Discharge status: Home / Self Care | Payer: 59 | Source: Ambulatory Visit | Attending: Family Medicine | Admitting: Family Medicine

## 2019-10-31 DIAGNOSIS — Z1231 Encounter for screening mammogram for malignant neoplasm of breast: Secondary | ICD-10-CM

## 2019-12-04 NOTE — Progress Notes (Signed)
49 y.o. G0P0000 Single Caucasian female here for annual exam.    Working in public health.  Stress during the pandemic.   Some periods of feeling hot.  Sleeping well.  Premenopausal FSH and E2 last year.   PCP:   London Pepper, MD  Patient's last menstrual period was 02/27/2012.           Sexually active: Yes.    The current method of family planning is status post hysterectomy.    Exercising: No.  walking, hiking, yoga when she can Smoker:  no  Health Maintenance: Pap: 09-08-11 Neg History of abnormal Pap:  Yes,  Hx cryotherapy 1995. Final pathology on cervix was benign.  MMG: 10-31-19 3D/Neg/density C/Birads1 Colonoscopy:  NEVER--she spoke with her PCP and will schedule BMD:   n/a  Result  n/a TDaP:  04-14-11 Gardasil:   no HIV:12/2017 Hep C:12/2017 Screening Labs: PCP.    reports that she has never smoked. She has never used smokeless tobacco. She reports current alcohol use. She reports that she does not use drugs.  Past Medical History:  Diagnosis Date  . Anxiety   . Bursitis of both feet 2020  . Cervical polyp 09/25/11   negative  . History of abnormal Pap smear    colpo, no treatment  . History of uterine fibroid 2013   82g, TLH  . Migraine with aura    1-2 per year  . PONV (postoperative nausea and vomiting)     Past Surgical History:  Procedure Laterality Date  . BILATERAL SALPINGECTOMY  03/22/2012   Procedure: BILATERAL SALPINGECTOMY;  Surgeon: Peri Maris, MD;  Location: [facility];  Service: Gynecology;  Laterality: Bilateral;  . COLPOSCOPY  years ago   no treatment  . CYSTOSCOPY  03/22/2012   Procedure: CYSTOSCOPY;  Surgeon: Peri Maris, MD;  Location: [facility];  Service: Gynecology;  Laterality: N/A;  . RHINOPLASTY  1994  . ROBOTIC ASSISTED TOTAL HYSTERECTOMY  03/22/2012   Procedure: ROBOTIC ASSISTED TOTAL HYSTERECTOMY; Adenomyosis, Surgeon: Peri Maris, MD;  Location: [facility];  Service: Gynecology;  Laterality: N/A;  . SEPTOPLASTY       Current Outpatient Medications  Medication Sig Dispense Refill  . Cholecalciferol (VITAMIN D PO) Take 1,000 Units by mouth once a week.    . cyanocobalamin 1000 MCG tablet Take 100 mcg by mouth daily.    . cyclobenzaprine (FLEXERIL) 5 MG tablet Take 1 tablet (5 mg total) by mouth 3 (three) times daily as needed for muscle spasms. 20 tablet 0  . fluticasone (FLONASE) 50 MCG/ACT nasal spray Place 1 spray into both nostrils daily.    Marland Kitchen GLUCOSAMINE-CHONDROITIN-MSM PO Take 1 tablet by mouth daily. Pt says the strength is  Glucosamin 750mg /Condroitin 400mg /MSM 375mg     . Krill Oil 1000 MG CAPS Take 1 capsule by mouth daily.    Marland Kitchen loratadine (CLARITIN) 10 MG tablet Take 10 mg by mouth daily.    . meloxicam (MOBIC) 15 MG tablet Take 15 mg by mouth daily as needed.    . Multiple Vitamins-Minerals (MULTIVITAMIN WITH MINERALS) tablet Take 1 tablet by mouth daily.     No current facility-administered medications for this visit.    Family History  Problem Relation Age of Onset  . Diabetes Father   . Hypertension Father   . Heart disease Father   . Depression Father   . Anxiety disorder Father   . Skin cancer Father   . Uterine cancer Mother        sarcoma  .  Osteoporosis Mother   . Dementia Mother   . Multiple births Paternal Grandmother        twins  . Hypertension Paternal Grandfather   . Heart disease Paternal Grandfather   . Osteoporosis Maternal Grandmother        severe    Review of Systems  All other systems reviewed and are negative.   Exam:   BP 110/70   Pulse 80   Resp 16   Ht 5\' 9"  (1.753 m)   Wt 160 lb (72.6 kg)   LMP 02/27/2012   BMI 23.63 kg/m     General appearance: alert, cooperative and appears stated age Head: normocephalic, without obvious abnormality, atraumatic Neck: no adenopathy, supple, symmetrical, trachea midline and thyroid normal to inspection and palpation Lungs: clear to auscultation bilaterally Breasts: normal appearance, no masses or  tenderness, No nipple retraction or dimpling, No nipple discharge or bleeding, No axillary adenopathy Heart: regular rate and rhythm Abdomen: soft, non-tender; no masses, no organomegaly Extremities: extremities normal, atraumatic, no cyanosis or edema Skin: skin color, texture, turgor normal. No rashes or lesions Lymph nodes: cervical, supraclavicular, and axillary nodes normal. Neurologic: grossly normal  Pelvic: External genitalia:  no lesions              No abnormal inguinal nodes palpated.              Urethra:  normal appearing urethra with no masses, tenderness or lesions              Bartholins and Skenes: normal                 Vagina: normal appearing vagina with normal color and discharge, no lesions              Cervix: no lesions              Pap taken: No. Bimanual Exam:  Uterus:  normal size, contour, position, consistency, mobility, non-tender              Adnexa: no mass, fullness, tenderness              Rectal exam: Yes.  .  Confirms.              Anus:  normal sphincter tone, no lesions  Chaperone was present for exam.  Assessment:   Well woman visit with normal exam. Status post robotic TLH with bilateral salpingectomy. Adenomyosis. Ovaries remain.  FH sarcoma.  Remote hx abnormal paps. Final pathology on cervix from hysterectomy was benign.  FH osteoporosis.  Plan: Mammogram screening discussed. Self breast awareness reviewed. Pap and HR HPV as above. Guidelines for Calcium, Vitamin D, regular exercise program including cardiovascular and weight bearing exercise. Information on colon cancer screening and ColoGuard testing. Follow up annually and prn.     After visit summary provided.

## 2019-12-05 ENCOUNTER — Encounter: Payer: Self-pay | Admitting: Obstetrics and Gynecology

## 2019-12-05 ENCOUNTER — Other Ambulatory Visit: Payer: Self-pay

## 2019-12-05 ENCOUNTER — Ambulatory Visit: Payer: 59 | Admitting: Obstetrics and Gynecology

## 2019-12-05 VITALS — BP 110/70 | HR 80 | Resp 16 | Ht 69.0 in | Wt 160.0 lb

## 2019-12-05 DIAGNOSIS — Z01419 Encounter for gynecological examination (general) (routine) without abnormal findings: Secondary | ICD-10-CM | POA: Diagnosis not present

## 2019-12-05 NOTE — Patient Instructions (Addendum)
Colorectal Cancer Screening  Colorectal cancer screening is a group of tests that are used to check for colorectal cancer before symptoms develop. Colorectal refers to the colon and rectum. The colon and rectum are located at the end of the digestive tract and carry bowel movements out of the body. Who should have screening? All adults starting at age 51 until age 53 should have screening. Your health care provider may recommend screening at age 42. You will have tests every 1-10 years, depending on your results and the type of screening test. You may have screening tests starting at an earlier age, or more frequently than other people, if you have any of the following risk factors:  A personal or family history of colorectal cancer or abnormal growths (polyps).  Inflammatory bowel disease, such as ulcerative colitis or Crohn's disease.  A history of having radiation treatment to the abdomen or pelvic area for cancer.  Colorectal cancer symptoms, such as changes in bowel habits or blood in your stool.  A type of colon cancer syndrome that is passed from parent to child (hereditary), such as: ? Lynch syndrome. ? Familial adenomatous polyposis. ? Turcot syndrome. ? Peutz-Jeghers syndrome. Screening recommendations for adults who are 82-58 years old vary depending on health. How is screening done? There are several types of colorectal screening tests. You may have one or more of the following:  Guaiac-based fecal occult blood testing. For this test, a stool (feces) sample is checked for hidden (occult) blood, which could be a sign of colorectal cancer.  Fecal immunochemical test (FIT). For this test, a stool sample is checked for blood, which could be a sign of colorectal cancer.  Stool DNA test. For this test, a stool sample is checked for blood and changes in DNA that could lead to colorectal cancer.  Sigmoidoscopy. During this test, a thin, flexible tube with a camera on the end  (sigmoidoscope) is used to examine the rectum and the lower colon.  Colonoscopy. During this test, a long, flexible tube with a camera on the end (colonoscope) is used to examine the entire colon and rectum. With a colonoscopy, it is possible to take a sample of tissue (biopsy) and remove small polyps during the test.  Virtual colonoscopy. Instead of a colonoscope, this type of colonoscopy uses X-rays (CT scan) and computers to produce images of the colon and rectum. What are the benefits of screening? Screening reduces your risk for colorectal cancer and can help identify cancer at an early stage, when the cancer can be removed or treated more easily. It is common for polyps to form in the lining of the colon, especially as you age. These polyps may be cancerous or become cancerous over time. Screening can identify these polyps. What are the risks of screening? Each screening test may have different risks.  Stool sample tests have fewer risks than other types of screening tests. However, you may need more tests to confirm results from a stool sample test.  Screening tests that involve X-rays expose you to low levels of radiation, which may slightly increase your cancer risk. The benefit of detecting cancer outweighs the slight increase in risk.  Screening tests such as sigmoidoscopy and colonoscopy may place you at risk for bleeding, intestinal damage, infection, or a reaction to medicines given during the exam. Talk with your health care provider to understand your risk for colorectal cancer and to make a screening plan that is right for you. Questions to ask your health care  provider  When should I start colorectal cancer screening?  What is my risk for colorectal cancer?  How often do I need screening?  Which screening tests do I need?  How do I get my test results?  What do my results mean? Where to find more information Learn more about colorectal cancer screening from:  The  [place]: www.cancer.org  The [employer]: www.cancer.gov Summary  Colorectal cancer screening is a group of tests used to check for colorectal cancer before symptoms develop.  Screening reduces your risk for colorectal cancer and can help identify cancer at an early stage, when the cancer can be removed or treated more easily.  All adults starting at age 45 until age 56 should have screening. Your health care provider may recommend screening at age 3.  You may have screening tests starting at an earlier age, or more frequently than other people, if you have certain risk factors.  Talk with your health care provider to understand your risk for colorectal cancer and to make a screening plan that is right for you. This information is not intended to replace advice given to you by your health care provider. Make sure you discuss any questions you have with your health care provider. Document Revised: 07/20/2018 Document Reviewed: 12/30/2016 Elsevier Patient Education  [place] DIET:  We recommended that you start or continue a regular exercise program for good health. Regular exercise means any activity that makes your heart beat faster and makes you sweat.  We recommend exercising at least 30 minutes per day at least 3 days a week, preferably 4 or 5.  We also recommend a diet low in fat and sugar.  Inactivity, poor dietary choices and obesity can cause diabetes, heart attack, stroke, and kidney damage, among others.    ALCOHOL AND SMOKING:  Women should limit their alcohol intake to no more than 7 drinks/beers/glasses of wine (combined, not each!) per week. Moderation of alcohol intake to this level decreases your risk of breast cancer and liver damage. And of course, no recreational drugs are part of a healthy lifestyle.  And absolutely no smoking or even second hand smoke. Most people know smoking can cause heart and lung diseases, but did  you know it also contributes to weakening of your bones? Aging of your skin?  Yellowing of your teeth and nails?  CALCIUM AND VITAMIN D:  Adequate intake of calcium and Vitamin D are recommended.  The recommendations for exact amounts of these supplements seem to change often, but generally speaking 600 mg of calcium (either carbonate or citrate) and 800 units of Vitamin D per day seems prudent. Certain women may benefit from higher intake of Vitamin D.  If you are among these women, your doctor will have told you during your visit.    PAP SMEARS:  Pap smears, to check for cervical cancer or precancers,  have traditionally been done yearly, although recent scientific advances have shown that most women can have pap smears less often.  However, every woman still should have a physical exam from her gynecologist every year. It will include a breast check, inspection of the vulva and vagina to check for abnormal growths or skin changes, a visual exam of the cervix, and then an exam to evaluate the size and shape of the uterus and ovaries.  And after 51 years of age, a rectal exam is indicated to check for rectal cancers. We will also provide age appropriate  advice regarding health maintenance, like when you should have certain vaccines, screening for sexually transmitted diseases, bone density testing, colonoscopy, mammograms, etc.   MAMMOGRAMS:  All women over 30 years old should have a yearly mammogram. Many facilities now offer a "3D" mammogram, which may cost around $50 extra out of pocket. If possible,  we recommend you accept the option to have the 3D mammogram performed.  It both reduces the number of women who will be called back for extra views which then turn out to be normal, and it is better than the routine mammogram at detecting truly abnormal areas.    COLONOSCOPY:  Colonoscopy to screen for colon cancer is recommended for all women at age 50.  We know, you hate the idea of the prep.  We agree,  BUT, having colon cancer and not knowing it is worse!!  Colon cancer so often starts as a polyp that can be seen and removed at colonscopy, which can quite literally save your life!  And if your first colonoscopy is normal and you have no family history of colon cancer, most women don't have to have it again for 10 years.  Once every ten years, you can do something that may end up saving your life, right?  We will be happy to help you get it scheduled when you are ready.  Be sure to check your insurance coverage so you understand how much it will cost.  It may be covered as a preventative service at no cost, but you should check your particular policy.

## 2020-06-06 ENCOUNTER — Ambulatory Visit: Payer: 59 | Admitting: Nurse Practitioner

## 2020-06-06 ENCOUNTER — Ambulatory Visit: Payer: 59 | Admitting: Obstetrics and Gynecology

## 2020-06-06 ENCOUNTER — Other Ambulatory Visit: Payer: Self-pay

## 2020-06-06 ENCOUNTER — Encounter: Payer: Self-pay | Admitting: Nurse Practitioner

## 2020-06-06 VITALS — BP 122/80 | HR 66 | Resp 14 | Ht 69.5 in | Wt 158.0 lb

## 2020-06-06 DIAGNOSIS — N941 Unspecified dyspareunia: Secondary | ICD-10-CM | POA: Diagnosis not present

## 2020-06-06 NOTE — Progress Notes (Signed)
   Acute Office Visit  Subjective:    Patient ID: Jasmine Cochran, female    DOB: 05-Oct-1970, 50 y.o.   MRN: [id number]   HPI 50 y.o. presents today for pain with intercourse that started 3 weeks ago. The pain is on the right side close to the introitus. She did sitz baths and says pain has improved. She does not feel like it is related to vaginal dryness and uses lubricants during intercourse. Same partner x 2 years, denies STD screening. Denies bleeding, redness, swelling, itching, odor, or discharge.    Review of Systems  Constitutional: Negative.   Genitourinary: Positive for dyspareunia and vaginal pain. Negative for genital sores, vaginal bleeding and vaginal discharge.       Objective:    Physical Exam Constitutional:      Appearance: Normal appearance.  Genitourinary:    General: Normal vulva.     Vagina: Normal.     Uterus: Absent.      BP 122/80 (BP Location: Right Arm, Patient Position: Sitting, Cuff Size: Normal)   Pulse 66   Resp 14   Ht 5' 9.5" (1.765 m)   Wt 158 lb (71.7 kg)   LMP 02/27/2012   BMI 23.00 kg/m  Wt Readings from Last 3 Encounters:  06/06/20 158 lb (71.7 kg)  12/05/19 160 lb (72.6 kg)  12/01/18 154 lb 3.2 oz (69.9 kg)        Assessment & Plan:   Problem List Items Addressed This Visit   None   Visit Diagnoses    Dyspareunia in female    -  Primary      Plan: Reassurance provided on normal exam. Likely she had a small cyst that has resolved. She will try a different lubricant as well. If symptoms worsen or do not improve she will return to office. She is agreeable to plan.     Tamela Gammon [facility], 2:45 PM 06/06/2020

## 2020-06-06 NOTE — Patient Instructions (Signed)
Dyspareunia, Female Dyspareunia is pain that is associated with sexual activity. This can affect any part of the genitals or lower abdomen. There are many possible causes of this condition. In some cases, diagnosing the cause of dyspareunia can be difficult. This condition can be mild, moderate, or severe. Depending on the cause, dyspareunia may get better with treatment, but may return (recur) over time. What are the causes? The cause of this condition is not always known. However, problems that affect the vulva, vagina, uterus, and other organs may cause dyspareunia. Common causes of this condition include:  Severe pain and tenderness of the vulva when it is touched (vulvodynia).  Vaginal dryness.  Giving birth.  Infection.  Skin changes or conditions.  Side effects of medicines.  Endometriosis. This is when tissue that is like the lining of the uterus grows on the outside of the uterus.  Psychological conditions. These include depression, anxiety, or traumatic experiences.  Allergic reaction.   What increases the risk? The following factors may make you more likely to develop this condition:  History of physical or sexual trauma.  Some medicines.  No longer having a monthly period (menopause).  Having recently given birth.  Taking baths using soaps that have perfumes. These can cause irritation.  Douching. What are the signs or symptoms? The main symptom of this condition is pain in any part of your genitals or lower abdomen during or after sex. This may include:  Irritation, burning, or stinging sensations in your vulva.  Discomfort when your vulva or surrounding area is touched.  Aching and throbbing pain that may be constant.  Pain that gets worse when something is inserted into your vagina. How is this diagnosed? This condition may be diagnosed based on:  Your symptoms, including where and when your pain occurs.  Your medical history.  A physical exam. A  pelvic exam will most likely be done.  Tests that include ultrasound, blood tests, and tests that check the body for infection.  Imaging tests, such as X-ray, MRI, and CT scan. You may be referred to a health care provider who specializes in women's health (gynecologist). How is this treated? Treatment depends on the cause of your condition and your symptoms. In most cases, you may need to stop sexual activity until your symptoms go away or get better. Treatment may include:  Lubricants, ointments, and creams.  Physical therapy.  Massage therapy.  Hormonal therapy.  Medicines to: ? Prevent or fight infection. ? Relieve pain. ? Help numb the area. ? Treat depression (antidepressants).  Counseling, which may include sex therapy.  Surgery. Follow these instructions at home: Lifestyle  Wear cotton underwear.  Use water-based lubricants as needed during sex. Avoid oil-based lubricants.  Do not use any products that can cause irritation. This may include certain condoms, spermicides, lubricants, soaps, tampons, vaginal sprays, or douches.  Always practice safe sex. Use a condom to prevent sexually transmitted infections (STIs).  Talk freely with your partner about your condition. General instructions  Take or apply over-the-counter and prescription medicines only as told by your health care provider.  Urinate before you have sex.  Consider joining a support group.  Get the results of any tests you have done. Ask your health care provider, or the department that is doing the procedure, when your results will be ready.  Keep all follow-up visits as told by your health care provider. This is important. Contact a health care provider if:  You have vaginal bleeding after having sex.  You develop a lump at the opening of your vagina even if the lump is painless.  You have: ? Abnormal discharge from your vagina. ? Vaginal dryness. ? Itchiness or irritation of your vulva  or vagina. ? A new rash. ? Symptoms that get worse or do not improve with treatment. ? A fever. ? Pain when you urinate. ? Blood in your urine. Get help right away if:  You have severe pain in your abdomen during or shortly after sex.  You pass out after sex. Summary  Dyspareunia is pain that is associated with sexual activity. This can affect any part of the genitals or lower abdomen.  There are many causes of this condition. Treatment depends on the cause and your symptoms. In most cases, you may need to stop sexual activity until your symptoms improve.  Take or apply over-the-counter and prescription medicines only as told by your health care provider.  Contact a health care provider if your symptoms get worse or do not improve with treatment.  Keep all follow-up visits as told by your health care provider. This is important. This information is not intended to replace advice given to you by your health care provider. Make sure you discuss any questions you have with your health care provider. Document Revised: 05/11/2019 Document Reviewed: 06/06/2018 Elsevier Patient Education  [place].

## 2020-10-25 ENCOUNTER — Other Ambulatory Visit: Payer: Self-pay | Admitting: Family Medicine

## 2020-10-25 DIAGNOSIS — Z1231 Encounter for screening mammogram for malignant neoplasm of breast: Secondary | ICD-10-CM

## 2020-11-01 ENCOUNTER — Other Ambulatory Visit: Payer: Self-pay

## 2020-11-01 ENCOUNTER — Ambulatory Visit
Admission: RE | Admit: 2020-11-01 | Discharge: 2020-11-01 | Disposition: A | Discharge status: Home / Self Care | Payer: 59 | Source: Ambulatory Visit | Attending: Family Medicine | Admitting: Family Medicine

## 2020-11-01 DIAGNOSIS — Z1231 Encounter for screening mammogram for malignant neoplasm of breast: Secondary | ICD-10-CM

## 2020-12-09 ENCOUNTER — Encounter: Payer: Self-pay | Admitting: Obstetrics and Gynecology

## 2020-12-09 ENCOUNTER — Ambulatory Visit (INDEPENDENT_AMBULATORY_CARE_PROVIDER_SITE_OTHER): Payer: 59 | Admitting: Obstetrics and Gynecology

## 2020-12-09 ENCOUNTER — Other Ambulatory Visit: Payer: Self-pay

## 2020-12-09 VITALS — BP 120/64 | HR 77 | Ht 68.5 in | Wt 164.0 lb

## 2020-12-09 DIAGNOSIS — N951 Menopausal and female climacteric states: Secondary | ICD-10-CM

## 2020-12-09 DIAGNOSIS — Z01419 Encounter for gynecological examination (general) (routine) without abnormal findings: Secondary | ICD-10-CM

## 2020-12-09 NOTE — Patient Instructions (Signed)

## 2020-12-09 NOTE — Progress Notes (Signed)
50 y.o. G0P0000 Single Caucasian female here for annual exam.    Having some pain with penetration with intercourse.  Patient having some hot flashes and irritability. Hot flashes started 4 months ago.  Sleeping ok. Joint aching.  Some weight gain.   Taking Estovera.  Mother passed away in 2022/11/05.  She had dementia.  PCP:  London Pepper, MD   Patient's last menstrual period was 02/27/2012.           Sexually active: Yes.    The current method of family planning is status post hysterectomy.    Exercising: Yes.     Walking, yoga and some weights/strength Smoker:  no  Health Maintenance: Pap: 09-08-11 Neg History of abnormal Pap:  yes, Hx cryotherapy 1995.  Final pathology on cervix was benign.  MMG: 11-01-20 3D/Neg/BiRads1 Colonoscopy:  NEVER--PCP setting up BMD:   N/A  Result  N/A TDaP:  2013 Gardasil:   no HIV: 12/2017 NR Hep C: 12/2017 Neg Screening Labs: PCP.   reports that she has never smoked. She has never used smokeless tobacco. She reports current alcohol use of about 2.0 standard drinks per week. She reports that she does not use drugs.  Past Medical History:  Diagnosis Date   Anxiety    Bursitis of both feet 2020   Cervical polyp 09/25/11   negative   History of abnormal Pap smear    colpo, no treatment   History of uterine fibroid 2013   82g, TLH   Migraine with aura    1-2 per year   PONV (postoperative nausea and vomiting)     Past Surgical History:  Procedure Laterality Date   BILATERAL SALPINGECTOMY  03/22/2012   Procedure: BILATERAL SALPINGECTOMY;  Surgeon: Peri Maris, MD;  Location: [facility];  Service: Gynecology;  Laterality: Bilateral;   COLPOSCOPY  years ago   no treatment   CYSTOSCOPY  03/22/2012   Procedure: CYSTOSCOPY;  Surgeon: Peri Maris, MD;  Location: [facility];  Service: Gynecology;  Laterality: N/A;   RHINOPLASTY  1994   ROBOTIC ASSISTED TOTAL HYSTERECTOMY  03/22/2012   Procedure: ROBOTIC ASSISTED TOTAL HYSTERECTOMY;  Adenomyosis, Surgeon: Peri Maris, MD;  Location: [facility];  Service: Gynecology;  Laterality: N/A;   SEPTOPLASTY      Current Outpatient Medications  Medication Sig Dispense Refill   Cholecalciferol (VITAMIN D PO) Take 1,000 Units by mouth once a week.     cyanocobalamin 1000 MCG tablet Take 100 mcg by mouth daily.     cyclobenzaprine (FLEXERIL) 5 MG tablet Take 1 tablet (5 mg total) by mouth 3 (three) times daily as needed for muscle spasms. 20 tablet 0   fluticasone (FLONASE) 50 MCG/ACT nasal spray Place 1 spray into both nostrils daily.     GLUCOSAMINE-CHONDROITIN-MSM PO Take 1 tablet by mouth daily. Pt says the strength is  Glucosamin '750mg'$ /Condroitin '400mg'$ /MSM '375mg'$      Krill Oil 1000 MG CAPS Take 1 capsule by mouth daily.     loratadine (CLARITIN) 10 MG tablet Take 10 mg by mouth daily.     meloxicam (MOBIC) 15 MG tablet Take 15 mg by mouth daily as needed.     Multiple Vitamins-Minerals (MULTIVITAMIN WITH MINERALS) tablet Take 1 tablet by mouth daily.     Rhubarb (ESTROVERA) 4 MG TABS Take 1 tablet by mouth daily.     No current facility-administered medications for this visit.    Family History  Problem Relation Age of Onset   Uterine cancer Mother  sarcoma   Osteoporosis Mother    Dementia Mother    Diabetes Father    Hypertension Father    Heart disease Father    Depression Father    Anxiety disorder Father    Skin cancer Father    Osteoporosis Maternal Grandmother        severe   Multiple births Paternal Grandmother        twins   Hypertension Paternal Grandfather    Heart disease Paternal Grandfather     Review of Systems  Genitourinary:  Positive for dyspareunia.  Skin:        Hot flashes  Psychiatric/Behavioral:  Positive for agitation.   All other systems reviewed and are negative.  Exam:   BP 120/64   Pulse 77   Ht 5' 8.5" (1.74 m)   Wt 164 lb (74.4 kg)   LMP 02/27/2012   SpO2 97%   BMI 24.57 kg/m     General appearance: alert,  cooperative and appears stated age Head: normocephalic, without obvious abnormality, atraumatic Neck: no adenopathy, supple, symmetrical, trachea midline and thyroid normal to inspection and palpation Lungs: clear to auscultation bilaterally Breasts: normal appearance, no masses or tenderness, No nipple retraction or dimpling, No nipple discharge or bleeding, No axillary adenopathy Heart: regular rate and rhythm Abdomen: soft, non-tender; no masses, no organomegaly Extremities: extremities normal, atraumatic, no cyanosis or edema Skin: skin color, texture, turgor normal. No rashes or lesions Lymph nodes: cervical, supraclavicular, and axillary nodes normal. Neurologic: grossly normal  Pelvic: External genitalia:  no lesions              No abnormal inguinal nodes palpated.              Urethra:  normal appearing urethra with no masses, tenderness or lesions              Bartholins and Skenes: normal                 Vagina: atrophy noted - erythema of vaginal mucosa and yellow liquid discharge.  No lesions.               Cervix: absent              Pap taken: no Bimanual Exam:  Uterus:  absent              Adnexa: no mass, fullness, tenderness              Rectal exam: yes.  Confirms.              Anus:  normal sphincter tone, no lesions  Chaperone was present for exam:  Estill Bamberg, CMA. Assessment:   Well woman visit with gynecologic exam. Status post robotic TLH with bilateral salpingectomy.  Adenomyosis.  Ovaries remain.  Vaginal atrophy.  Menopausal symptoms.  FH sarcoma.  Remote hx abnormal paps.  Final pathology on cervix from hysterectomy was benign.  FH osteoporosis. Bereavement.   Plan: Mammogram screening discussed. Self breast awareness reviewed. Pap and HR HPV as above. Guidelines for Calcium, Vitamin D, regular exercise program including cardiovascular and weight bearing exercise. Check FSH and estradiol. We discussed treatment for menopausal symptoms - herbal, ERT,  local vaginal estrogen, Neurontin, Paxil and Effexor.  Brochure on menopause.  Bereavement support given. She will update her vaccines:  flu, Shingrix, and Covid.  Follow up annually and prn.   After visit summary provided.

## 2020-12-10 LAB — ESTRADIOL: Estradiol: 86 pg/mL

## 2020-12-10 LAB — FOLLICLE STIMULATING HORMONE: FSH: 48.3 m[IU]/mL

## 2021-09-24 ENCOUNTER — Other Ambulatory Visit: Payer: Self-pay | Admitting: Family Medicine

## 2021-09-24 DIAGNOSIS — Z1231 Encounter for screening mammogram for malignant neoplasm of breast: Secondary | ICD-10-CM

## 2021-10-24 ENCOUNTER — Telehealth: Payer: Self-pay | Admitting: *Deleted

## 2021-10-24 NOTE — Telephone Encounter (Signed)
Patient called stating she would like to start on HRT for hot flashes. Last seen on 12/09/20 states you told her to call if she wanted to start HRT and Rx would be sent in. Patient said she would like to try for 1 month and if she doing well she will get refills x 1 year at annual exam in 11/2021, patient is not scheduled for exam yet.  Please advise

## 2021-10-24 NOTE — Telephone Encounter (Signed)
I recommend an office visit to discuss HRT and start medication as I have not seen her in almost one year.   She can then come in for her annual exam and do a recheck at the same time to see how the medication is working for her.

## 2021-10-27 NOTE — Telephone Encounter (Signed)
Patient informed with below , message sent to appointments to schedule.

## 2021-10-28 ENCOUNTER — Ambulatory Visit: Payer: 59 | Admitting: Obstetrics and Gynecology

## 2021-10-28 ENCOUNTER — Encounter: Payer: Self-pay | Admitting: Obstetrics and Gynecology

## 2021-10-28 VITALS — BP 110/70 | HR 84 | Ht 68.5 in | Wt 161.0 lb

## 2021-10-28 DIAGNOSIS — N951 Menopausal and female climacteric states: Secondary | ICD-10-CM | POA: Diagnosis not present

## 2021-10-28 MED ORDER — ESTRADIOL ACETATE 0.05 MG/24HR VA RING: 1.0000 | VAGINAL_RING | VAGINAL | 0 refills | Status: DC

## 2021-10-28 NOTE — Patient Instructions (Signed)
Estradiol Vaginal Ring (Estrogen Replacement Therapy) What is this medication? ESTRADIOL (es tra DYE ole) reduces the number and severity of hot flashes due to menopause. It may also help relieve the symptoms of menopause, such as vaginal irritation, dryness, or pain during sex. It works by increasing levels of the hormone estrogen in the body. This medication is an estrogen hormone. This medicine may be used for other purposes; ask your health care provider or pharmacist if you have questions. COMMON BRAND NAME(S): Femring What should I tell my care team before I take this medication? They need to know if you have any of these conditions: Abnormal vaginal bleeding Blood vessel disease or blood clots Breast, cervical, endometrial, ovarian, liver, or uterine cancer Dementia Diabetes Gallbladder disease Heart disease or recent heart attack High blood pressure High cholesterol High level of calcium in the blood Hysterectomy Kidney disease Liver disease Migraine headaches Protein C deficiency Protein S deficiency Stroke Systemic lupus erythematosus (SLE) Tobacco smoker An unusual or allergic reaction to estrogens, other hormones, medications, foods, dyes, or preservatives Pregnant or trying to get pregnant Breast-feeding How should I use this medication? This medication may be inserted by you or your care team. Follow the directions that are included with your prescription. If you are unsure how to insert the ring, contact your care team. The vaginal ring should remain in place for 90 days. After 90 days you should replace your old ring and insert a new one. Do not stop using except on the advice of your care team. Contact your care team regarding the use of this medication in children. Special care may be needed. A patient package insert for the product will be given with each prescription and refill. Read this sheet carefully each time. The sheet may change frequently. Overdosage: If  you think you have taken too much of this medicine contact a poison control center or emergency room at once. NOTE: This medicine is only for you. Do not share this medicine with others. What if I miss a dose? If you miss a dose, use it as soon as you can. If it is almost time for your next dose, use only that dose. Do not use double or extra doses. What may interact with this medication? Do not take this medication with any of the following: Aromatase inhibitors like aminoglutethimide, anastrozole, exemestane, letrozole, testolactone, vorozole This medication may also interact with the following: Carbamazepine Certain antibiotics used to treat infections Certain barbiturates used for inducing sleep or treating seizures Grapefruit juice Medications for fungus infections like itraconazole and ketoconazole Raloxifene or tamoxifen Rifabutin, rifampin, or rifapentine Ritonavir St. John's Wort This list may not describe all possible interactions. Give your health care provider a list of all the medicines, herbs, non-prescription drugs, or dietary supplements you use. Also tell them if you smoke, drink alcohol, or use illegal drugs. Some items may interact with your medicine. What should I watch for while using this medication? Visit your care team for regular checks on your progress. You will need a regular breast and pelvic exam and Pap smear while on this medication. You should also discuss the need for regular mammograms with your care team, and follow his or her guidelines for these tests. This medication can make your body retain fluid, making your fingers, hands, or ankles swell. Your blood pressure can go up. Contact your care team if you feel you are retaining fluid. If you have any reason to think you are pregnant, stop taking this medication  right away and contact your care team. Smoking increases the risk of getting a blood clot or having a stroke while you are taking this medication,  especially if you are more than 53 years old. You are strongly advised not to smoke. If you wear contact lenses and notice visual changes, or if the lenses begin to feel uncomfortable, consult your eye care specialist. This medication can increase the risk of developing a condition (endometrial hyperplasia) that may lead to cancer of the lining of the uterus. Taking progestins, another hormone medication, with this medication lowers the risk of developing this condition. Therefore, if your uterus has not been removed (by a hysterectomy), your care team may prescribe a progestin for you to take together with your estrogen. You should know, however, that taking estrogens with progestins may have additional health risks. You should discuss the use of estrogens and progestins with your care team to determine the benefits and risks for you. If you are going to have surgery, you may need to stop taking this medication. Consult your care team for advice before you schedule the surgery. You may bathe or participate in other activities while using this medication. You do not need to remove the vaginal ring during sexual or other activities unless you are more comfortable doing so. Within the 90-day dosage period, you may remove the vaginal ring, rinse it with clean lukewarm (not hot or boiling) water, and re-insert the ring as needed. What side effects may I notice from receiving this medication? Side effects that you should report to your care team as soon as possible: Allergic reactions--skin rash, itching, hives, swelling of the face, lips, tongue, or throat Blood clot--pain, swelling, or warmth in the leg, shortness of breath, chest pain Breast tissue changes, new lumps, redness, pain, or discharge from the nipple Gallbladder problems--severe stomach pain, nausea, vomiting, fever Increase in blood pressure Liver injury--right upper belly pain, loss of appetite, nausea, light-colored stool, dark yellow or  brown urine, yellowing skin or eyes, unusual weakness or fatigue Stroke--sudden numbness or weakness of the face, arm, or leg, trouble speaking, confusion, trouble walking, loss of balance or coordination, dizziness, severe headache, change in vision Unusual vaginal discharge, itching, or odor Vaginal bleeding after menopause, pelvic pain Side effects that usually do not require medical attention (report to your care team if they continue or are bothersome): Bloating Breast pain or tenderness Hair loss Nausea Stomach pain Vaginal irritation at application site Vomiting This list may not describe all possible side effects. Call your doctor for medical advice about side effects. You may report side effects to FDA at 1-800-FDA-1088. Where should I keep my medication? Keep out of the reach of children and pets. Store at room temperature between 15 and 30 degrees C (59 and 86 degrees F). Throw away any unused medication after the expiration date. NOTE: This sheet is a summary. It may not cover all possible information. If you have questions about this medicine, talk to your doctor, pharmacist, or health care provider.  2023 Elsevier/Gold Standard (2020-04-26 00:00:00)

## 2021-10-28 NOTE — Progress Notes (Signed)
GYNECOLOGY  VISIT   HPI: 51 y.o.   Single  Caucasian  female   G0P0000 with Patient's last menstrual period was 02/27/2012.   here to discuss menopausal symptoms. Patient complaining of hot flashes, mood swings, fatigue and joint ache.  Having trouble sleeping. Also complaining of dyspareunia. Some stress.  Work is busy.  Joint pain is affecting her activity level.   GYNECOLOGIC HISTORY: Patient's last menstrual period was 02/27/2012. Contraception:  Hyst Menopausal hormone therapy:  none Last mammogram:  11-01-20 Neg/BiRads1.  Appt. 7/26 Last pap smear:    09-08-11 Neg. Hx cryotherapy 1995.  Final pathology on cervix was benign.         OB History     Gravida  0   Para  0   Term  0   Preterm  0   AB  0   Living  0      SAB  0   IAB  0   Ectopic  0   Multiple  0   Live Births                 There are no problems to display for this patient.   Past Medical History:  Diagnosis Date   Anxiety    Bursitis of both feet 2020   Cervical polyp 09/25/11   negative   History of abnormal Pap smear    colpo, no treatment   History of uterine fibroid 2013   82g, TLH   Migraine with aura    1-2 per year   PONV (postoperative nausea and vomiting)     Past Surgical History:  Procedure Laterality Date   BILATERAL SALPINGECTOMY  03/22/2012   Procedure: BILATERAL SALPINGECTOMY;  Surgeon: Peri Maris, MD;  Location: [facility];  Service: Gynecology;  Laterality: Bilateral;   COLPOSCOPY  years ago   no treatment   CYSTOSCOPY  03/22/2012   Procedure: CYSTOSCOPY;  Surgeon: Peri Maris, MD;  Location: [facility];  Service: Gynecology;  Laterality: N/A;   RHINOPLASTY  1994   ROBOTIC ASSISTED TOTAL HYSTERECTOMY  03/22/2012   Procedure: ROBOTIC ASSISTED TOTAL HYSTERECTOMY; Adenomyosis, Surgeon: Peri Maris, MD;  Location: [facility];  Service: Gynecology;  Laterality: N/A;   SEPTOPLASTY      Current Outpatient Medications  Medication Sig Dispense Refill    Cholecalciferol (VITAMIN D PO) Take 1,000 Units by mouth once a week.     cyanocobalamin 1000 MCG tablet Take 100 mcg by mouth daily.     cyclobenzaprine (FLEXERIL) 5 MG tablet Take 1 tablet (5 mg total) by mouth 3 (three) times daily as needed for muscle spasms. 20 tablet 0   fluticasone (FLONASE) 50 MCG/ACT nasal spray Place 1 spray into both nostrils daily.     GLUCOSAMINE-CHONDROITIN-MSM PO Take 1 tablet by mouth daily. Pt says the strength is  Glucosamin '750mg'$ /Condroitin '400mg'$ /MSM '375mg'$      Krill Oil Omega-3 500 MG CAPS 1 capsule     loratadine (CLARITIN) 10 MG tablet Take 10 mg by mouth daily.     meloxicam (MOBIC) 15 MG tablet Take 15 mg by mouth daily as needed.     Multiple Vitamins-Minerals (MULTIVITAMIN WITH MINERALS) tablet Take 1 tablet by mouth daily.     Turmeric (CURCUMIN 95 PO) 1 capsule     No current facility-administered medications for this visit.     ALLERGIES: Patient has no known allergies.  Family History  Problem Relation Age of Onset   Uterine cancer Mother  sarcoma   Osteoporosis Mother    Dementia Mother    Diabetes Father    Hypertension Father    Heart disease Father    Depression Father    Anxiety disorder Father    Skin cancer Father    Osteoporosis Maternal Grandmother        severe   Multiple births Paternal Grandmother        twins   Hypertension Paternal Grandfather    Heart disease Paternal Grandfather     Social History   Socioeconomic History   Marital status: Single    Spouse name: Not on file   Number of children: 0   Years of education: Not on file   Highest education level: Not on file  Occupational History   Occupation: HEALTH EDUCATOR    Employer: [employer]  Tobacco Use   Smoking status: Never   Smokeless tobacco: Never  Vaping Use   Vaping Use: Never used  Substance and Sexual Activity   Alcohol use: Yes    Alcohol/week: 2.0 standard drinks of alcohol    Types: 2 Glasses of wine per week    Comment: 1-2  drinks a week wine or beer   Drug use: No   Sexual activity: Yes    Partners: Male    Birth control/protection: Surgical    Comment: R-TLH Bilat Salp 03/22/12  Other Topics Concern   Not on file  Social History Narrative   Not on file   Social Determinants of Health   Financial Resource Strain: Not on file  Food Insecurity: Not on file  Transportation Needs: Not on file  Physical Activity: Not on file  Stress: Not on file  Social Connections: Not on file  Intimate Partner Violence: Not on file    Review of Systems  Constitutional:  Positive for fatigue.  Genitourinary:  Positive for dyspareunia.  Musculoskeletal:  Positive for joint swelling (joint aches).  Psychiatric/Behavioral:         Mood swings  All other systems reviewed and are negative.   PHYSICAL EXAMINATION:    BP 110/70   Pulse 84   Ht 5' 8.5" (1.74 m)   Wt 161 lb (73 kg)   LMP 02/27/2012   SpO2 100%   BMI 24.12 kg/m     General appearance: alert, cooperative and appears stated age  ASSESSMENT  Menopausal symptoms.  Status post robotic TLH with bilateral salpingectomy.  Adenomyosis.  Ovaries remain. Stress at work.   PLAN  We discussed treatment of menopausal symptoms with ERT, vaginal estrogen, SSRI/SNRI. Routes of administering ERT reviewed.  Patient will start Femring 0.05 mg per vagina every 90 days.  Disp:  1, RF none.  WHI and risks of stroke, DVT, and PE reviewed.  Follow up for annual exam next month.  Has appointment for mammogram.    An After Visit Summary was printed and given to the patient.  35 min total time was spent for this patient encounter, including preparation, face-to-face counseling with the patient, coordination of care, and documentation of the encounter.

## 2021-11-05 ENCOUNTER — Ambulatory Visit: Payer: 59

## 2021-11-10 ENCOUNTER — Ambulatory Visit
Admission: RE | Admit: 2021-11-10 | Discharge: 2021-11-10 | Disposition: A | Discharge status: Home / Self Care | Payer: 59 | Source: Ambulatory Visit | Attending: Family Medicine | Admitting: Family Medicine

## 2021-11-10 DIAGNOSIS — Z1231 Encounter for screening mammogram for malignant neoplasm of breast: Secondary | ICD-10-CM

## 2021-11-12 ENCOUNTER — Other Ambulatory Visit: Payer: Self-pay | Admitting: Family Medicine

## 2021-11-12 DIAGNOSIS — R928 Other abnormal and inconclusive findings on diagnostic imaging of breast: Secondary | ICD-10-CM

## 2021-11-19 ENCOUNTER — Ambulatory Visit: Admission: RE | Admit: 2021-11-19 | Payer: 59 | Source: Ambulatory Visit

## 2021-11-19 ENCOUNTER — Ambulatory Visit
Admission: RE | Admit: 2021-11-19 | Discharge: 2021-11-19 | Disposition: A | Discharge status: Home / Self Care | Payer: 59 | Source: Ambulatory Visit | Attending: Family Medicine | Admitting: Family Medicine

## 2021-11-19 DIAGNOSIS — R928 Other abnormal and inconclusive findings on diagnostic imaging of breast: Secondary | ICD-10-CM

## 2021-12-01 ENCOUNTER — Other Ambulatory Visit: Payer: Self-pay | Admitting: Family Medicine

## 2021-12-01 ENCOUNTER — Other Ambulatory Visit (HOSPITAL_BASED_OUTPATIENT_CLINIC_OR_DEPARTMENT_OTHER): Payer: Self-pay | Admitting: Family Medicine

## 2021-12-01 DIAGNOSIS — E785 Hyperlipidemia, unspecified: Secondary | ICD-10-CM

## 2021-12-09 NOTE — Progress Notes (Unsigned)
51 y.o. G0P0000 Single Caucasian female here for annual exam.    Using Femring. Helping the hot flashes, less intense and not as frequent.  Improved sexual functioning.  Has not had night sweats prior to or after starting Femring.   Has periodic spasms of her left shoulder.  Has used Flexeril in the past.  Asking for refill. Has done PT.   Cholesterol elevated with PCP.   Patient will do a carotid artery ultrasound.   She is working on a plan to increase exercise and lower cholesterol.  PCP:  London Pepper, MD   Patient's last menstrual period was 02/27/2012.           Sexually active: Yes.    The current method of family planning is status post hysterectomy.    Exercising: Yes.     Walking, biking, yoga, weights Smoker:  no  Health Maintenance: Pap:  09-08-11 Neg History of abnormal Pap:  yes,  Hx cryotherapy 1995.  Final pathology on cervix was benign.  MMG:  11-10-21 Rt.Br.poss.asymmetry;Lt.Br.Neg. Diag.Rt./Neg/Birads1 Colonoscopy:  07/2021 normal;10 years BMD:   n/a  Result  n/a TDaP: PCP Gardasil:   no HIV: 12/2017 NR Hep C:12/2017 Neg Screening Labs:   PCP   reports that she has never smoked. She has never used smokeless tobacco. She reports current alcohol use of about 2.0 standard drinks of alcohol per week. She reports that she does not use drugs.  Past Medical History:  Diagnosis Date   Anxiety    Bursitis of both feet 2020   Cervical polyp 09/25/11   negative   History of abnormal Pap smear    colpo, no treatment   History of uterine fibroid 2013   82g, TLH   Migraine with aura    1-2 per year   PONV (postoperative nausea and vomiting)     Past Surgical History:  Procedure Laterality Date   BILATERAL SALPINGECTOMY  03/22/2012   Procedure: BILATERAL SALPINGECTOMY;  Surgeon: Peri Maris, MD;  Location: [facility];  Service: Gynecology;  Laterality: Bilateral;   COLPOSCOPY  years ago   no treatment   CYSTOSCOPY  03/22/2012   Procedure: CYSTOSCOPY;   Surgeon: Peri Maris, MD;  Location: [facility];  Service: Gynecology;  Laterality: N/A;   RHINOPLASTY  1994   ROBOTIC ASSISTED TOTAL HYSTERECTOMY  03/22/2012   Procedure: ROBOTIC ASSISTED TOTAL HYSTERECTOMY; Adenomyosis, Surgeon: Peri Maris, MD;  Location: [facility];  Service: Gynecology;  Laterality: N/A;   SEPTOPLASTY      Current Outpatient Medications  Medication Sig Dispense Refill   Cholecalciferol (VITAMIN D PO) Take 1,000 Units by mouth once a week.     cyanocobalamin 1000 MCG tablet Take 100 mcg by mouth daily.     Estradiol Acetate 0.05 MG/24HR RING Place 1 each vaginally every 3 (three) months. 1 each 0   fluticasone (FLONASE) 50 MCG/ACT nasal spray Place 1 spray into both nostrils daily.     GLUCOSAMINE-CHONDROITIN-MSM PO Take 1 tablet by mouth daily. Pt says the strength is  Glucosamin '750mg'$ /Condroitin '400mg'$ /MSM '375mg'$      Krill Oil Omega-3 500 MG CAPS 1 capsule     loratadine (CLARITIN) 10 MG tablet Take 10 mg by mouth daily.     meloxicam (MOBIC) 15 MG tablet Take 15 mg by mouth daily as needed.     Multiple Vitamins-Minerals (MULTIVITAMIN WITH MINERALS) tablet Take 1 tablet by mouth daily.     Turmeric (CURCUMIN 95 PO) 1 capsule     cyclobenzaprine (FLEXERIL)  5 MG tablet Take 1 tablet (5 mg total) by mouth 3 (three) times daily as needed for muscle spasms. (Patient not taking: Reported on 12/10/2021) 20 tablet 0   No current facility-administered medications for this visit.    Family History  Problem Relation Age of Onset   Uterine cancer Mother        sarcoma   Osteoporosis Mother    Dementia Mother    Diabetes Father    Hypertension Father    Heart disease Father    Depression Father    Anxiety disorder Father    Skin cancer Father    Osteoporosis Maternal Grandmother        severe   Multiple births Paternal Grandmother        twins   Hypertension Paternal Grandfather    Heart disease Paternal Grandfather    Breast cancer Neg Hx     Review of Systems   All other systems reviewed and are negative.   Exam:   BP 108/60   Pulse 68   Ht 5' 8.5" (1.74 m)   Wt 167 lb (75.8 kg)   LMP 02/27/2012   SpO2 97%   BMI 25.02 kg/m     General appearance: alert, cooperative and appears stated age Head: normocephalic, without obvious abnormality, atraumatic Neck: no adenopathy, supple, symmetrical, trachea midline and thyroid normal to inspection and palpation Lungs: clear to auscultation bilaterally Breasts: normal appearance, no masses or tenderness, No nipple retraction or dimpling, No nipple discharge or bleeding, No axillary adenopathy Heart: regular rate and rhythm Abdomen: soft, non-tender; no masses, no organomegaly Extremities: extremities normal, atraumatic, no cyanosis or edema Skin: skin color, texture, turgor normal. No rashes or lesions Lymph nodes: cervical, supraclavicular, and axillary nodes normal. Neurologic: grossly normal  Pelvic: External genitalia:  no lesions              No abnormal inguinal nodes palpated.              Urethra:  normal appearing urethra with no masses, tenderness or lesions              Bartholins and Skenes: normal                 Vagina: normal appearing vagina with normal color and discharge, no lesions              Cervix: absent              Pap taken:  no Bimanual Exam:  Uterus:  normal size, contour, position, consistency, mobility, non-tender              Adnexa: no mass, fullness, tenderness              Rectal exam: yes.  Confirms.              Anus:  normal sphincter tone, no lesions  Chaperone was present for exam:  Estill Bamberg, CMA  Assessment:   Well woman visit with gynecologic exam. Status post robotic TLH with bilateral salpingectomy.  Adenomyosis.  cervix bening on final pathology. Ovaries remain.   ERT.  Doing well with Femring.  Shoulder spasms.  Plan: Mammogram screening discussed. Self breast awareness reviewed. Pap and HR HPV as above. Guidelines for Calcium, Vitamin D,  regular exercise program including cardiovascular and weight bearing exercise. Refill of Femring for one year.  Refill of Flexeril 5 mg po tid prn.  #20, RF none.   Follow up annually and prn.  After visit summary provided.

## 2021-12-10 ENCOUNTER — Ambulatory Visit (INDEPENDENT_AMBULATORY_CARE_PROVIDER_SITE_OTHER): Payer: 59 | Admitting: Obstetrics and Gynecology

## 2021-12-10 ENCOUNTER — Encounter: Payer: Self-pay | Admitting: Obstetrics and Gynecology

## 2021-12-10 VITALS — BP 108/60 | HR 68 | Ht 68.5 in | Wt 167.0 lb

## 2021-12-10 DIAGNOSIS — Z01419 Encounter for gynecological examination (general) (routine) without abnormal findings: Secondary | ICD-10-CM

## 2021-12-10 MED ORDER — ESTRADIOL ACETATE 0.05 MG/24HR VA RING: 1.0000 | VAGINAL_RING | VAGINAL | 3 refills | Status: DC

## 2021-12-10 MED ORDER — CYCLOBENZAPRINE HCL 5 MG PO TABS: 5.0000 mg | ORAL_TABLET | Freq: Three times a day (TID) | ORAL | 0 refills | Status: DC | PRN

## 2021-12-10 NOTE — Patient Instructions (Signed)

## 2021-12-29 ENCOUNTER — Ambulatory Visit (HOSPITAL_BASED_OUTPATIENT_CLINIC_OR_DEPARTMENT_OTHER)
Admission: RE | Admit: 2021-12-29 | Discharge: 2021-12-29 | Disposition: A | Discharge status: Home / Self Care | Payer: 59 | Source: Ambulatory Visit | Attending: Family Medicine | Admitting: Family Medicine

## 2021-12-29 DIAGNOSIS — E785 Hyperlipidemia, unspecified: Secondary | ICD-10-CM | POA: Insufficient documentation

## 2022-02-02 ENCOUNTER — Other Ambulatory Visit: Payer: Self-pay | Admitting: Family Medicine

## 2022-02-02 DIAGNOSIS — K769 Liver disease, unspecified: Secondary | ICD-10-CM

## 2022-02-09 ENCOUNTER — Ambulatory Visit
Admission: RE | Admit: 2022-02-09 | Discharge: 2022-02-09 | Disposition: A | Discharge status: Home / Self Care | Payer: 59 | Source: Ambulatory Visit | Attending: Family Medicine | Admitting: Family Medicine

## 2022-02-09 DIAGNOSIS — K769 Liver disease, unspecified: Secondary | ICD-10-CM

## 2022-04-28 IMAGING — MG MM DIGITAL SCREENING BILAT W/ TOMO AND CAD
8 series · 9 of 24 positions shown · non-contrast
Comparison: Previous exam(s).

CLINICAL DATA: Screening.

EXAM:
DIGITAL SCREENING BILATERAL MAMMOGRAM WITH TOMOSYNTHESIS AND CAD
TECHNIQUE: Bilateral screening digital craniocaudal and mediolateral oblique
mammograms were obtained. Bilateral screening digital breast
tomosynthesis was performed. The images were evaluated with
computer-aided detection.

[R MLO synth-2D]
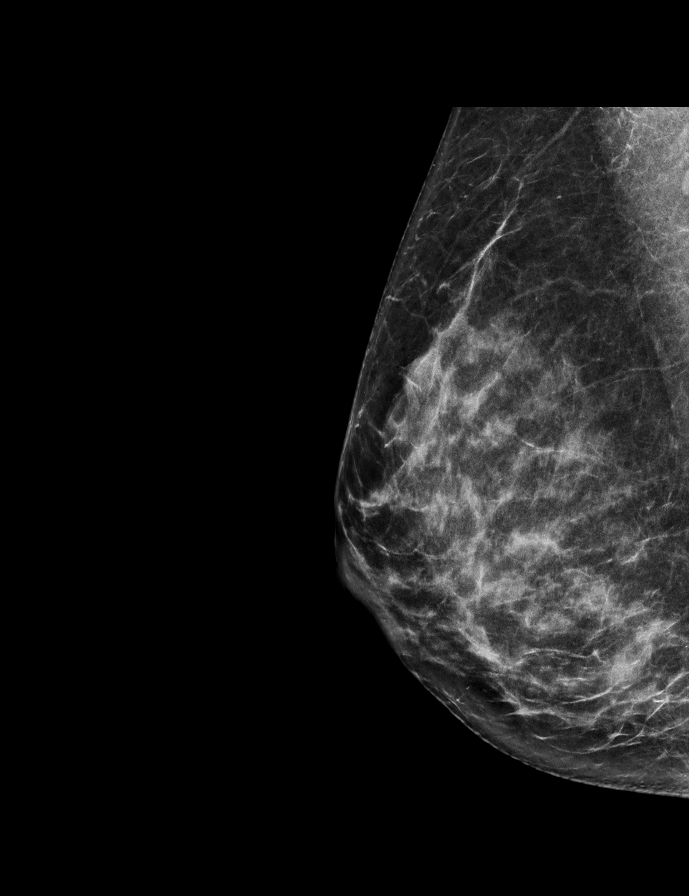

[L CC synth-2D]
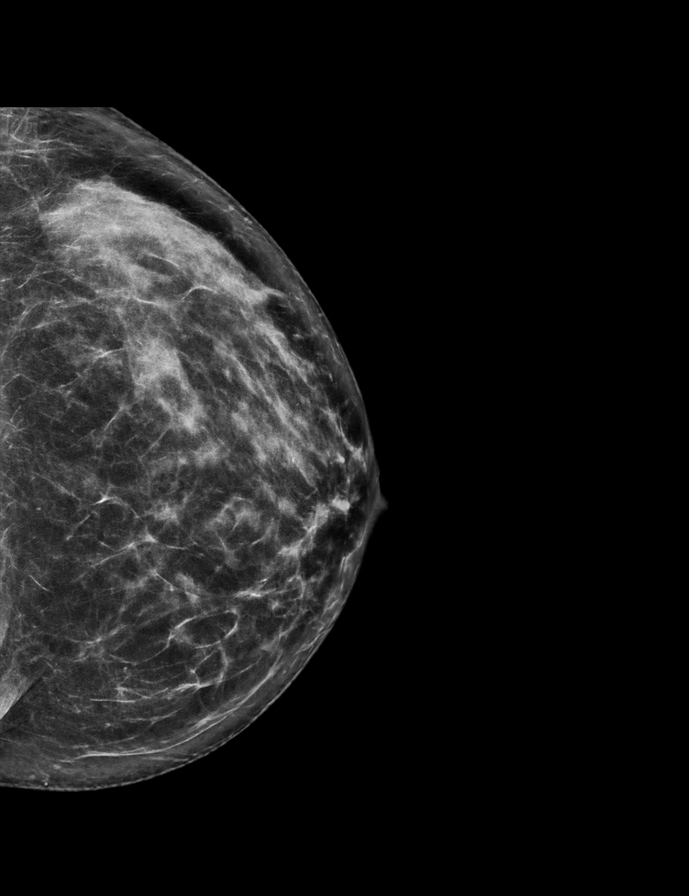

[L MLO synth-2D]
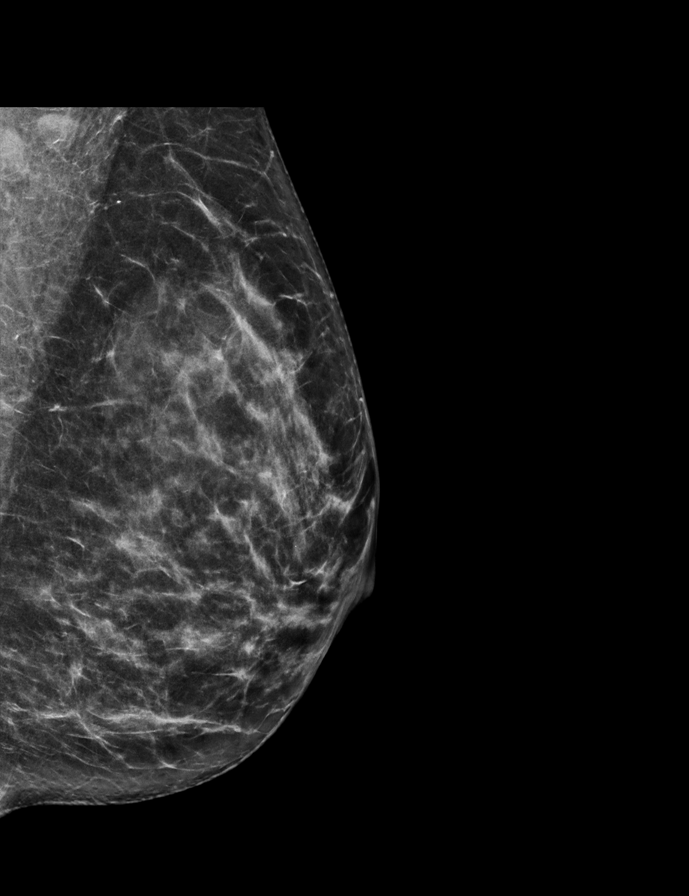

[R CC synth-2D]
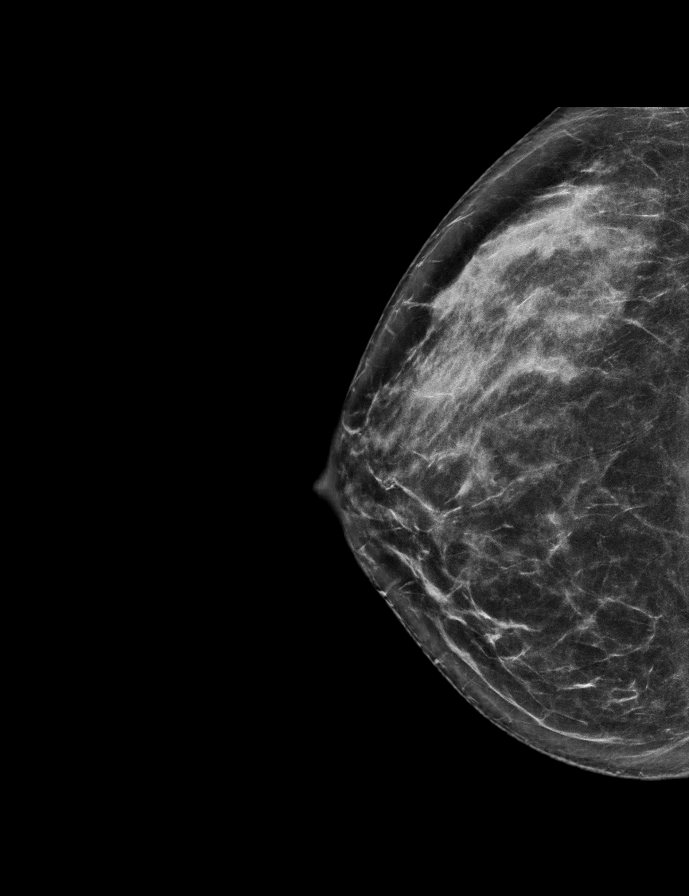

[R CC tomo · 2 of 67 frames shown]
[frame 22/67]
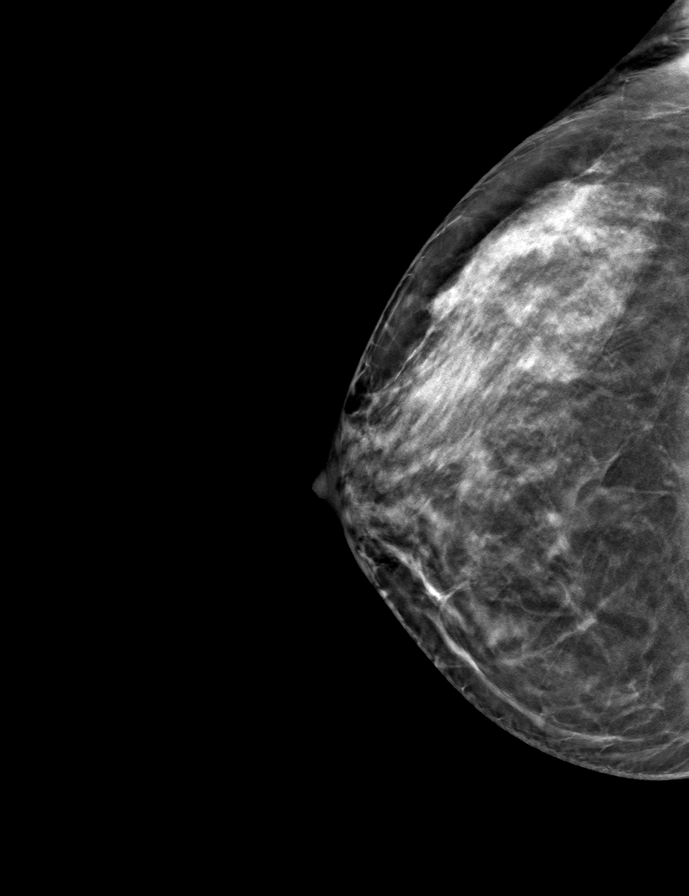
[frame 34/67]
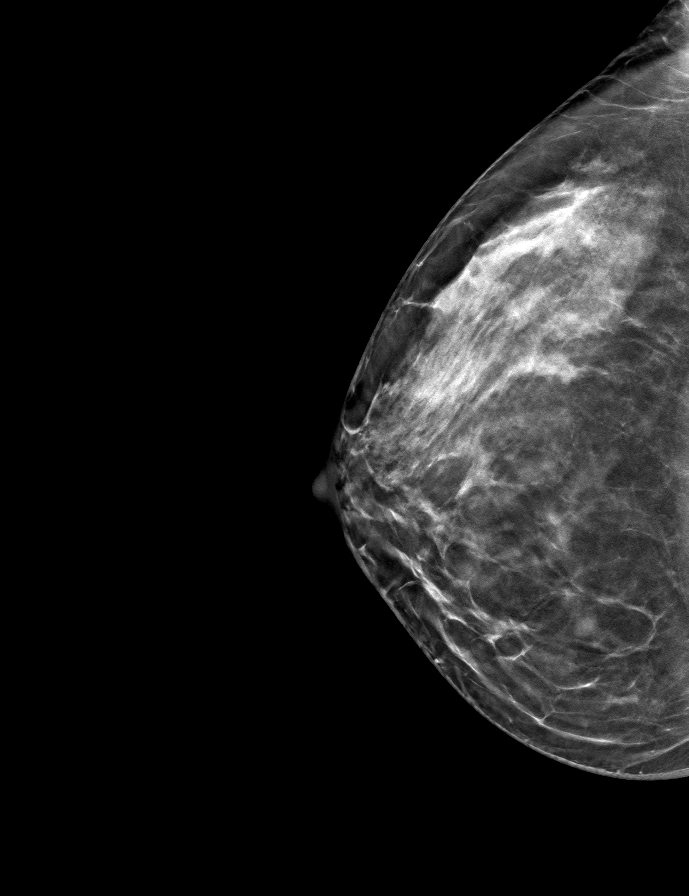

[L CC tomo · tomo slice 33/66.0]
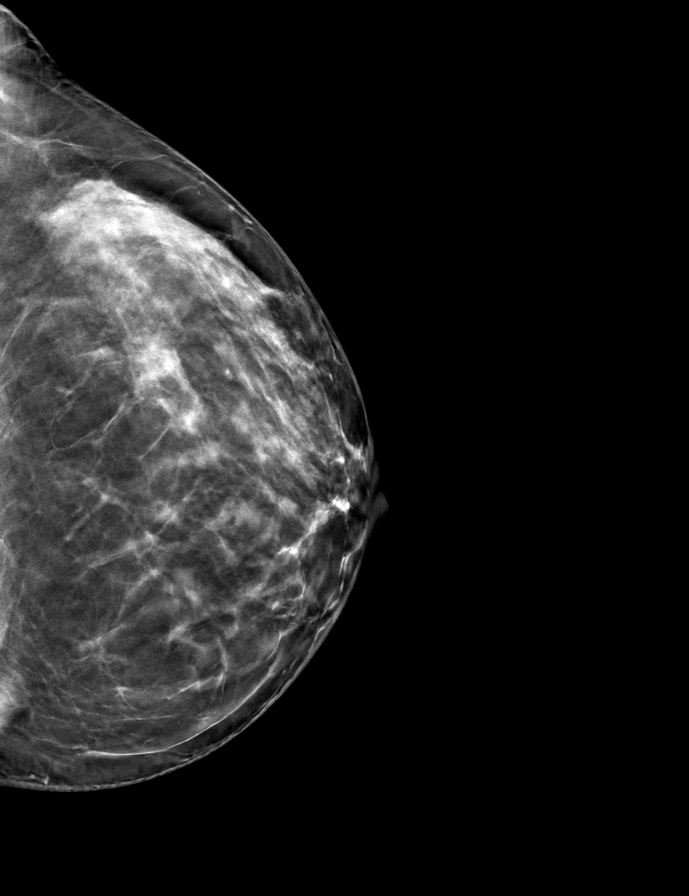

[L MLO tomo · tomo slice 34/67.0]
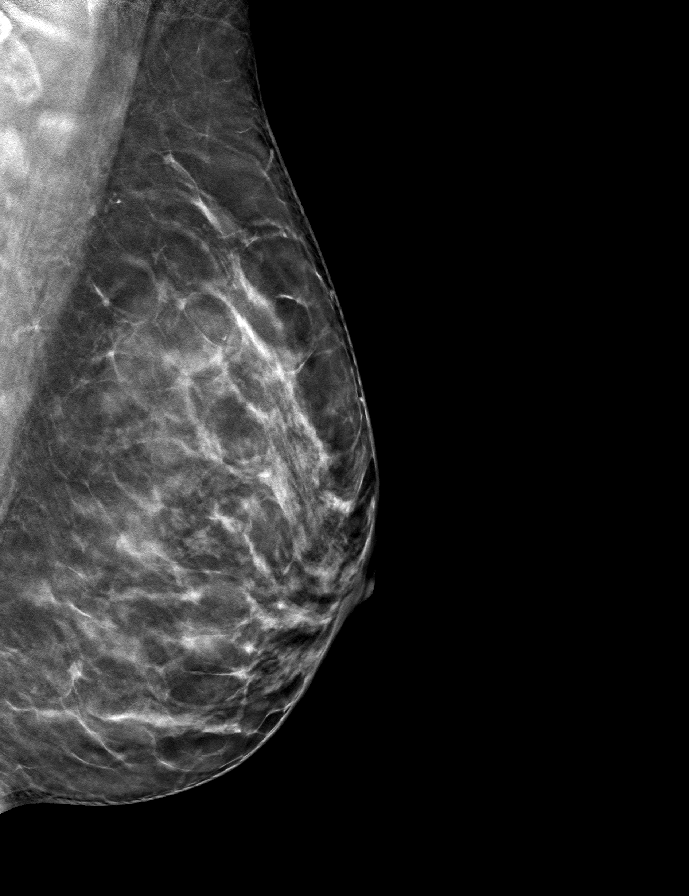

[R MLO tomo · tomo slice 33/65.0]
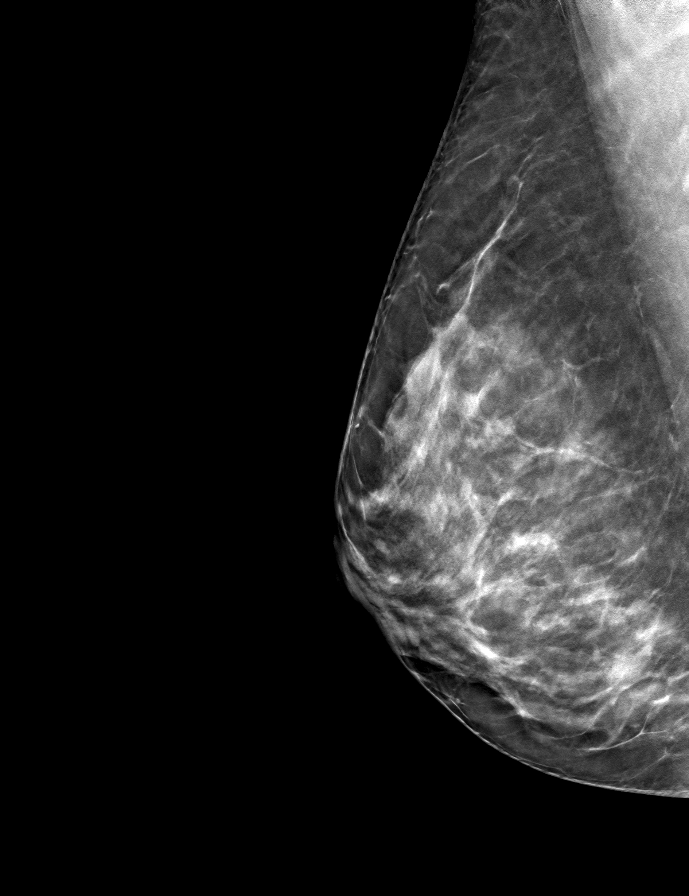

[9 of 24 positions shown; findings below may reference images not displayed]

ACR Breast Density Category c: The breast tissue is heterogeneously
dense, which may obscure small masses.
FINDINGS: There are no findings suspicious for malignancy.
IMPRESSION: No mammographic evidence of malignancy. A result letter of this
screening mammogram will be mailed directly to the patient.

RECOMMENDATION:
Screening mammogram in one year. (Code:Q3-W-BC3)

BI-RADS CATEGORY  1: Negative.

## 2022-10-22 ENCOUNTER — Other Ambulatory Visit: Payer: Self-pay | Admitting: Family Medicine

## 2022-10-22 DIAGNOSIS — Z1231 Encounter for screening mammogram for malignant neoplasm of breast: Secondary | ICD-10-CM

## 2022-11-13 ENCOUNTER — Ambulatory Visit: Admission: RE | Admit: 2022-11-13 | Payer: 59 | Source: Ambulatory Visit

## 2022-11-13 DIAGNOSIS — Z1231 Encounter for screening mammogram for malignant neoplasm of breast: Secondary | ICD-10-CM

## 2022-12-01 NOTE — Progress Notes (Signed)
52 y.o. G0P0000 Single Caucasian female here for annual exam.    Lowering her cholesterol.  Would like to do weight loss.   Working on work life balance.   Femring is helping but does start to wear off at 2.5 months.  Would like to continue this.   No partner change.  Declines STD screening.   She will see a nutritionist through PCP.   PCP:   Farris Has, MD  Patient's last menstrual period was 02/27/2012.           Sexually active: Yes.    The current method of family planning is status post hysterectomy.    Exercising: Yes.     Walking, yoga, weight training, biking Smoker:  no  Health Maintenance: Pap:  09/01/11 neg History of abnormal Pap:  yes, Hx cryotherapy 1995.  Final pathology on cervix was benign.   MMG:  11/13/22 Breast Density Cat C, BI-RADS CAT 1 neg Colonoscopy:  2023 per pt - due in 10 years.  BMD:   n/a  Result  n/a TDaP:  PCP 2023 Gardasil:   no HIV: 12/2017 NR Hep C: 12/2017 ne Screening Labs: PCP   reports that she has never smoked. She has never used smokeless tobacco. She reports current alcohol use of about 2.0 standard drinks of alcohol per week. She reports that she does not use drugs.  Past Medical History:  Diagnosis Date   Anxiety    Bursitis of both feet 2020   Cervical polyp 09/25/11   negative   History of abnormal Pap smear    colpo, no treatment   History of uterine fibroid 2013   82g, TLH   Migraine with aura    1-2 per year   PONV (postoperative nausea and vomiting)     Past Surgical History:  Procedure Laterality Date   BILATERAL SALPINGECTOMY  03/22/2012   Procedure: BILATERAL SALPINGECTOMY;  Surgeon: Alison Murray, MD;  Location: [facility];  Service: Gynecology;  Laterality: Bilateral;   COLPOSCOPY  years ago   no treatment   CYSTOSCOPY  03/22/2012   Procedure: CYSTOSCOPY;  Surgeon: Alison Murray, MD;  Location: [facility];  Service: Gynecology;  Laterality: N/A;   RHINOPLASTY  1994   ROBOTIC ASSISTED TOTAL HYSTERECTOMY   03/22/2012   Procedure: ROBOTIC ASSISTED TOTAL HYSTERECTOMY; Adenomyosis, Surgeon: Alison Murray, MD;  Location: [facility];  Service: Gynecology;  Laterality: N/A;   SEPTOPLASTY      Current Outpatient Medications  Medication Sig Dispense Refill   Cholecalciferol (VITAMIN D PO) Take 1,000 Units by mouth once a week.     cyanocobalamin 1000 MCG tablet Take 100 mcg by mouth daily.     cyclobenzaprine (FLEXERIL) 5 MG tablet Take 1 tablet (5 mg total) by mouth 3 (three) times daily as needed for muscle spasms. 20 tablet 0   Estradiol Acetate 0.05 MG/24HR RING Place 1 each vaginally every 3 (three) months. 1 each 3   fluticasone (FLONASE) 50 MCG/ACT nasal spray Place 1 spray into both nostrils daily.     GLUCOSAMINE-CHONDROITIN-MSM PO Take 1 tablet by mouth daily. Pt says the strength is  Glucosamin 750mg /Condroitin 400mg /MSM 375mg      Krill Oil Omega-3 500 MG CAPS 1 capsule     loratadine (CLARITIN) 10 MG tablet Take 10 mg by mouth daily.     meloxicam (MOBIC) 15 MG tablet Take 15 mg by mouth daily as needed.     Multiple Vitamins-Minerals (MULTIVITAMIN WITH MINERALS) tablet Take 1 tablet by mouth  daily.     Turmeric (CURCUMIN 95 PO) 1 capsule     No current facility-administered medications for this visit.    Family History  Problem Relation Age of Onset   Uterine cancer Mother        sarcoma   Osteoporosis Mother    Dementia Mother    Diabetes Father    Hypertension Father    Heart disease Father    Depression Father    Anxiety disorder Father    Skin cancer Father    Osteoporosis Maternal Grandmother        severe   Multiple births Paternal Grandmother        twins   Hypertension Paternal Grandfather    Heart disease Paternal Grandfather    Breast cancer Neg Hx     Review of Systems  All other systems reviewed and are negative.   Exam:   Ht 5' 8.5" (1.74 m)   Wt 161 lb (73 kg)   LMP 02/27/2012   BMI 24.12 kg/m     General appearance: alert, cooperative and appears  stated age Head: normocephalic, without obvious abnormality, atraumatic Neck: no adenopathy, supple, symmetrical, trachea midline and thyroid normal to inspection and palpation Lungs: clear to auscultation bilaterally Breasts: normal appearance, no masses or tenderness, No nipple retraction or dimpling, No nipple discharge or bleeding, No axillary adenopathy Heart: regular rate and rhythm Abdomen: soft, non-tender; no masses, no organomegaly Extremities: extremities normal, atraumatic, no cyanosis or edema Skin: skin color, texture, turgor normal. No rashes or lesions Lymph nodes: cervical, supraclavicular, and axillary nodes normal. Neurologic: grossly normal  Pelvic: External genitalia:  no lesions              No abnormal inguinal nodes palpated.              Urethra:  normal appearing urethra with no masses, tenderness or lesions              Bartholins and Skenes: normal                 Vagina: normal appearing vagina with normal color and discharge, no lesions              Cervix:  absent              Pap taken: no Bimanual Exam:  Uterus:  absent              Adnexa: no mass, fullness, tenderness              Rectal exam: yes.  Confirms.              Anus:  normal sphincter tone, no lesions  Femring removed and patient will replace it.   Chaperone was present for exam:  Warren Lacy, CMA  Assessment:   Well woman visit with gynecologic exam. Status post robotic TLH with bilateral salpingectomy.  Adenomyosis.  cervix bening on final pathology. Ovaries remain.   ERT.  Doing well with Femring.   Plan: Mammogram screening discussed. Pap and HR HPV as above. Guidelines for Calcium, Vitamin D, regular exercise program including cardiovascular and weight bearing exercise. Refill of Femring for one year.  I did discuss WHI and increased risk of stroke, DVT, and PE.   Follow up annually and prn.   After visit summary provided.

## 2022-12-15 ENCOUNTER — Encounter: Payer: Self-pay | Admitting: Obstetrics and Gynecology

## 2022-12-15 ENCOUNTER — Ambulatory Visit (INDEPENDENT_AMBULATORY_CARE_PROVIDER_SITE_OTHER): Payer: 59 | Admitting: Obstetrics and Gynecology

## 2022-12-15 VITALS — BP 116/82 | HR 62 | Ht 68.5 in | Wt 161.0 lb

## 2022-12-15 DIAGNOSIS — Z01419 Encounter for gynecological examination (general) (routine) without abnormal findings: Secondary | ICD-10-CM

## 2022-12-15 MED ORDER — ESTRADIOL ACETATE 0.05 MG/24HR VA RING: 1.0000 | VAGINAL_RING | VAGINAL | 3 refills | Status: DC

## 2022-12-15 NOTE — Patient Instructions (Signed)

## 2023-09-02 ENCOUNTER — Other Ambulatory Visit: Payer: Self-pay | Admitting: Family Medicine

## 2023-09-02 DIAGNOSIS — Z0000 Encounter for general adult medical examination without abnormal findings: Secondary | ICD-10-CM

## 2023-11-15 ENCOUNTER — Ambulatory Visit
Admission: RE | Admit: 2023-11-15 | Discharge: 2023-11-15 | Disposition: A | Discharge status: Home / Self Care | Source: Ambulatory Visit | Attending: Family Medicine | Admitting: Family Medicine

## 2023-11-15 DIAGNOSIS — Z0000 Encounter for general adult medical examination without abnormal findings: Secondary | ICD-10-CM

## 2023-12-16 NOTE — Progress Notes (Signed)
 53 y.o. G0P0000 Single Caucasian female here for annual exam. Would like to discuss getting bone density due to recent fracture and family hx of osteoporosis for mother.    Fractured her right ankle after a fall.   Has scoliosis.   Wants to see a nutritionist to lose weight and lower cholesterol.  Wants to continue the Femring .    Controls hot flashes and helps sleep quality.  PCP: Kip Righter, MD   Patient's last menstrual period was 02/27/2012.           Sexually active: Yes.    The current method of family planning is status post hysterectomy.    Menopausal hormone therapy:  Estradiol  ring  Exercising: Yes.    Walking, yoga, weight lifting Smoker:  no  OB History  Gravida Para Term Preterm AB Living  0 0 0 0 0 0  SAB IAB Ectopic Multiple Live Births  0 0 0 0      HEALTH MAINTENANCE: Last 2 paps:  08/31/21 neg History of abnormal Pap or positive HPV:  yes, Hx cryotherapy 1995.  Final pathology on cervix was benign.  Mammogram:   11/15/23 Breast Density Cat C, BIRADS Cat 1 neg  Colonoscopy:  2023 per pt - due in 10 years. Bone Density:  n/a  Result  n/a   Immunization History  Administered Date(s) Administered   Moderna Covid-19 Fall Seasonal Vaccine 67yrs & older 02/09/2023   Tdap 04/14/2011      reports that she has never smoked. She has never used smokeless tobacco. She reports current alcohol use of about 2.0 standard drinks of alcohol per week. She reports that she does not use drugs.  Past Medical History:  Diagnosis Date   Anxiety    Bursitis of both feet 2020   Cervical polyp 09/25/11   negative   History of abnormal Pap smear    colpo, no treatment   History of uterine fibroid 2013   82g, TLH   Migraine with aura    1-2 per year   PONV (postoperative nausea and vomiting)     Past Surgical History:  Procedure Laterality Date   BILATERAL SALPINGECTOMY  03/22/2012   Procedure: BILATERAL SALPINGECTOMY;  Surgeon: Montie SHAUNNA Chesterfield, MD;  Location: [facility];  Service: Gynecology;  Laterality: Bilateral;   COLPOSCOPY  years ago   no treatment   CYSTOSCOPY  03/22/2012   Procedure: CYSTOSCOPY;  Surgeon: Montie SHAUNNA Chesterfield, MD;  Location: [facility];  Service: Gynecology;  Laterality: N/A;   RHINOPLASTY  1994   ROBOTIC ASSISTED TOTAL HYSTERECTOMY  03/22/2012   Procedure: ROBOTIC ASSISTED TOTAL HYSTERECTOMY; Adenomyosis, Surgeon: Montie SHAUNNA Chesterfield, MD;  Location: [facility];  Service: Gynecology;  Laterality: N/A;   SEPTOPLASTY      Current Outpatient Medications  Medication Sig Dispense Refill   CALCIUM PO Take by mouth.     Cholecalciferol (VITAMIN D PO) Take 1,000 Units by mouth once a week.     cyanocobalamin 1000 MCG tablet Take 100 mcg by mouth daily.     cyclobenzaprine  (FLEXERIL ) 5 MG tablet Take 1 tablet (5 mg total) by mouth 3 (three) times daily as needed for muscle spasms. 20 tablet 0   Estradiol  Acetate 0.05 MG/24HR RING Place 1 each vaginally every 3 (three) months. 1 each 3   fluticasone (FLONASE) 50 MCG/ACT nasal spray Place 1 spray into both nostrils daily.     GLUCOSAMINE-CHONDROITIN-MSM PO Take 1 tablet by mouth daily. Pt says the strength is  Glucosamin  750mg /Condroitin 400mg /MSM 375mg      Krill Oil Omega-3 500 MG CAPS 1 capsule     loratadine (CLARITIN) 10 MG tablet Take 10 mg by mouth daily.     Multiple Vitamins-Minerals (MULTIVITAMIN WITH MINERALS) tablet Take 1 tablet by mouth daily.     Turmeric (CURCUMIN 95 PO) 1 capsule     No current facility-administered medications for this visit.    ALLERGIES: Patient has no known allergies.  Family History  Problem Relation Age of Onset   Uterine cancer Mother        sarcoma   Osteoporosis Mother    Dementia Mother    Diabetes Father    Hypertension Father    Heart disease Father    Depression Father    Anxiety disorder Father    Skin cancer Father    Osteoporosis Maternal Grandmother        severe   Multiple births Paternal Grandmother        twins   Hypertension  Paternal Grandfather    Heart disease Paternal Grandfather    Breast cancer Neg Hx     Review of Systems  All other systems reviewed and are negative.   PHYSICAL EXAM:  BP 116/78 (BP Location: Left Arm, Patient Position: Sitting)   Pulse 87   Ht 5' 9.75 (1.772 m)   Wt 165 lb (74.8 kg)   LMP 02/27/2012   SpO2 98%   BMI 23.85 kg/m     General appearance: alert, cooperative and appears stated age Head: normocephalic, without obvious abnormality, atraumatic Neck: no adenopathy, supple, symmetrical, trachea midline and thyroid normal to inspection and palpation Lungs: clear to auscultation bilaterally Breasts: normal appearance, no masses or tenderness, No nipple retraction or dimpling, No nipple discharge or bleeding, No axillary adenopathy Heart: regular rate and rhythm Abdomen: soft, non-tender; no masses, no organomegaly Extremities: extremities normal, atraumatic, no cyanosis or edema Skin: skin color, texture, turgor normal. No rashes or lesions Lymph nodes: cervical, supraclavicular, and axillary nodes normal. Neurologic: grossly normal  Pelvic: External genitalia:  no lesions              No abnormal inguinal nodes palpated.              Urethra:  normal appearing urethra with no masses, tenderness or lesions              Bartholins and Skenes: normal                 Vagina: normal appearing vagina with normal color and discharge, no lesions              Cervix: absent              Pap taken: no Bimanual Exam:  Uterus:  absent              Adnexa: no mass, fullness, tenderness              Rectal exam: yes.  Confirms.              Anus:  normal sphincter tone, no lesions  Chaperone was present for exam:  Kari HERO, CMA  ASSESSMENT: Well woman visit with gynecologic exam. Status post robotic TLH with bilateral salpingectomy.  Adenomyosis.  cervix benign on final pathology. Ovaries remain.   ERT.  Doing well with Femring .  PHQ-2-9: 0 History of fracture.    PLAN: Mammogram screening discussed. Self breast awareness reviewed. Pap and HRV collected:  no.  Not  indicated. Guidelines for Calcium, Vitamin D, regular exercise program including cardiovascular and weight bearing exercise. Medication refills:  Femring  q 90 days for one year.  We discussed increased risk of stroke, DVT, and PE.  Patient wishes to continue this prescription.  Labs with PCP. [facility] at [facility].  Follow up:  yearly and prn.

## 2023-12-21 ENCOUNTER — Encounter: Payer: Self-pay | Admitting: Obstetrics and Gynecology

## 2023-12-21 ENCOUNTER — Ambulatory Visit (INDEPENDENT_AMBULATORY_CARE_PROVIDER_SITE_OTHER): Payer: 59 | Admitting: Obstetrics and Gynecology

## 2023-12-21 VITALS — BP 116/78 | HR 87 | Ht 69.75 in | Wt 165.0 lb

## 2023-12-21 DIAGNOSIS — Z01419 Encounter for gynecological examination (general) (routine) without abnormal findings: Secondary | ICD-10-CM

## 2023-12-21 DIAGNOSIS — Z1331 Encounter for screening for depression: Secondary | ICD-10-CM | POA: Diagnosis not present

## 2023-12-21 DIAGNOSIS — Z8781 Personal history of (healed) traumatic fracture: Secondary | ICD-10-CM

## 2023-12-21 MED ORDER — ESTRADIOL ACETATE 0.05 MG/24HR VA RING: 1.0000 | VAGINAL_RING | VAGINAL | 3 refills | Status: AC

## 2023-12-21 NOTE — Patient Instructions (Signed)

## 2024-03-02 ENCOUNTER — Encounter (HOSPITAL_BASED_OUTPATIENT_CLINIC_OR_DEPARTMENT_OTHER): Payer: Self-pay | Admitting: Emergency Medicine

## 2024-03-02 ENCOUNTER — Other Ambulatory Visit: Payer: Self-pay

## 2024-03-02 ENCOUNTER — Emergency Department (HOSPITAL_BASED_OUTPATIENT_CLINIC_OR_DEPARTMENT_OTHER): Admitting: Radiology

## 2024-03-02 ENCOUNTER — Emergency Department (HOSPITAL_BASED_OUTPATIENT_CLINIC_OR_DEPARTMENT_OTHER)
Admission: EM | Admit: 2024-03-02 | Discharge: 2024-03-02 | Disposition: A | Discharge status: Home / Self Care | Attending: Emergency Medicine | Admitting: Emergency Medicine

## 2024-03-02 DIAGNOSIS — S46912A Strain of unspecified muscle, fascia and tendon at shoulder and upper arm level, left arm, initial encounter: Secondary | ICD-10-CM | POA: Insufficient documentation

## 2024-03-02 DIAGNOSIS — Y92410 Unspecified street and highway as the place of occurrence of the external cause: Secondary | ICD-10-CM | POA: Insufficient documentation

## 2024-03-02 DIAGNOSIS — S4992XA Unspecified injury of left shoulder and upper arm, initial encounter: Secondary | ICD-10-CM | POA: Diagnosis present

## 2024-03-02 DIAGNOSIS — Z79899 Other long term (current) drug therapy: Secondary | ICD-10-CM | POA: Insufficient documentation

## 2024-03-02 MED ORDER — CYCLOBENZAPRINE HCL 10 MG PO TABS: 10.0000 mg | ORAL_TABLET | Freq: Two times a day (BID) | ORAL | 0 refills | Status: AC | PRN

## 2024-03-02 MED ORDER — NAPROXEN 500 MG PO TABS: 500.0000 mg | ORAL_TABLET | Freq: Two times a day (BID) | ORAL | 0 refills | Status: AC

## 2024-03-02 NOTE — Discharge Instructions (Signed)
 May take naproxen twice a day as needed for pain.  Do not take extra ibuprofen at the same time.  May take Flexeril  3 times a day as needed for pain/muscle spasms.  Apply heat pads to the neck/shoulder muscles to help with pain as well.  You will be sore for several days.  Follow-up with PCP in 1 to 2 weeks if no improvement.  Return to ED sooner if any symptoms worsen including severe headaches, dizziness, chest pain, numbness/tingling/weakness.
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
## 2024-05-23 ENCOUNTER — Other Ambulatory Visit (HOSPITAL_BASED_OUTPATIENT_CLINIC_OR_DEPARTMENT_OTHER)

## 2024-12-26 ENCOUNTER — Ambulatory Visit: Admitting: Obstetrics and Gynecology
